# Patient Record
Sex: Female | Born: 1985 | Race: White | Hispanic: No | Marital: Married | Smoking: Never smoker
Health system: Southern US, Community
[De-identification: ages and names within clinical notes are randomized; demographics above are authoritative.]

## PROBLEM LIST (undated history)

## (undated) DIAGNOSIS — F419 Anxiety disorder, unspecified: Secondary | ICD-10-CM

## (undated) DIAGNOSIS — O139 Gestational [pregnancy-induced] hypertension without significant proteinuria, unspecified trimester: Secondary | ICD-10-CM

## (undated) DIAGNOSIS — F32A Depression, unspecified: Secondary | ICD-10-CM

## (undated) DIAGNOSIS — F329 Major depressive disorder, single episode, unspecified: Secondary | ICD-10-CM

## (undated) DIAGNOSIS — E041 Nontoxic single thyroid nodule: Secondary | ICD-10-CM

## (undated) HISTORY — DX: Gestational (pregnancy-induced) hypertension without significant proteinuria, unspecified trimester: O13.9

## (undated) HISTORY — DX: Nontoxic single thyroid nodule: E04.1

## (undated) HISTORY — DX: Major depressive disorder, single episode, unspecified: F32.9

## (undated) HISTORY — DX: Anxiety disorder, unspecified: F41.9

## (undated) HISTORY — PX: HERNIA REPAIR: SHX51

## (undated) HISTORY — DX: Depression, unspecified: F32.A

---

## 2016-12-12 ENCOUNTER — Emergency Department (HOSPITAL_COMMUNITY)
Admission: EM | Admit: 2016-12-12 | Discharge: 2016-12-12 | Disposition: A | Discharge status: Home / Self Care | Payer: Managed Care, Other (non HMO) | Attending: Emergency Medicine | Admitting: Emergency Medicine

## 2016-12-12 ENCOUNTER — Encounter (HOSPITAL_COMMUNITY): Payer: Self-pay | Admitting: Emergency Medicine

## 2016-12-12 ENCOUNTER — Emergency Department (HOSPITAL_COMMUNITY): Payer: Managed Care, Other (non HMO)

## 2016-12-12 DIAGNOSIS — Y998 Other external cause status: Secondary | ICD-10-CM | POA: Insufficient documentation

## 2016-12-12 DIAGNOSIS — Y9351 Activity, roller skating (inline) and skateboarding: Secondary | ICD-10-CM | POA: Diagnosis not present

## 2016-12-12 DIAGNOSIS — Y929 Unspecified place or not applicable: Secondary | ICD-10-CM | POA: Insufficient documentation

## 2016-12-12 DIAGNOSIS — M25531 Pain in right wrist: Secondary | ICD-10-CM | POA: Insufficient documentation

## 2016-12-12 MED ORDER — IBUPROFEN 800 MG PO TABS: 800.0000 mg | ORAL_TABLET | Freq: Three times a day (TID) | ORAL | 0 refills | Status: DC

## 2016-12-12 MED ORDER — IBUPROFEN 800 MG PO TABS
800.0000 mg | ORAL_TABLET | Freq: Once | ORAL | Status: AC
  Administered 2016-12-12: 800 mg via ORAL
  Filled 2016-12-12: qty 1

## 2016-12-12 NOTE — ED Notes (Signed)
Ortho tech in. 

## 2016-12-12 NOTE — ED Provider Notes (Signed)
[facility]-EMERGENCY DEPT Provider Note   CSN: [id number] Arrival date & time: 12/12/16  1159     History   Chief Complaint Chief Complaint  Patient presents with  . Wrist Pain    HPI Adriana Petersen is a 31 y.o. female presenting with right wrist pain after fall.  Patient states she was rollerblading down a hill when she lost control and fell backwards. She states she tried to twist to catch herself, and landed on her palm with her arm outstretched. She reports some minimal bruising on her left buttock, but denies head injury or loss of consciousness. She denies pain elsewhere. She states the pain is on the dorsal aspect of her right wrist at the base of her thumb. She has pain when she moves her wrist or thumb. She denies being on blood thinners. She has not taken anything for pain prior to arrival. She is right-handed. She denies any numbness or tingling in her hand.  HPI  History reviewed. No pertinent past medical history.  There are no active problems to display for this patient.   History reviewed. No pertinent surgical history.  OB History    No data available       Home Medications    Prior to Admission medications   Medication Sig Start Date End Date Taking? Authorizing Provider  ibuprofen (ADVIL,MOTRIN) 800 MG tablet Take 1 tablet (800 mg total) by mouth 3 (three) times daily. 12/12/16   Coco Sharpnack, PA-C    Family History No family history on file.  Social History Social History  Substance Use Topics  . Smoking status: Never Smoker  . Smokeless tobacco: Never Used  . Alcohol use No     Allergies   Patient has no allergy information on record.   Review of Systems Review of Systems  Musculoskeletal: Positive for arthralgias.  Neurological: Negative for numbness.     Physical Exam Updated Vital Signs BP 122/89 (BP Location: Left Arm)   Pulse 94   Temp 98 F (36.7 C) (Oral)   Resp 20   Ht 5\' 2"  (1.575 m)   Wt 66.7 kg (147 lb)   LMP  11/14/2016   SpO2 97%   BMI 26.89 kg/m   Physical Exam  Constitutional: She is oriented to person, place, and time. She appears well-developed and well-nourished. No distress.  HENT:  Head: Normocephalic and atraumatic.  Eyes: Pupils are equal, round, and reactive to light.  Neck: Normal range of motion.  Cardiovascular: Normal rate and regular rhythm.   Pulmonary/Chest: Effort normal and breath sounds normal.  Musculoskeletal:  Patient with obvious swelling at the MCP of the right thumb. No laceration or other injury. Patient with decreased range of motion of the wrist due to pain. Full range of motion of fingers 2 - 5 without pain. Decreased range of motion of the thumb due to pain. Tenderness to palpation at the proximal aspect of the thumb just proximal to the anatomic snuffbox. Pulses intact bilaterally. Sensation intact bilaterally. Color and warmth equal bilaterally. Compartments soft.  Neurological: She is alert and oriented to person, place, and time.  Skin: Skin is warm and dry.  Psychiatric: She has a normal mood and affect.  Nursing note and vitals reviewed.    ED Treatments / Results  Labs (all labs ordered are listed, but only abnormal results are displayed) Labs Reviewed - No data to display  EKG  EKG Interpretation None       Radiology Dg Wrist Complete Right  Result  Date: 12/12/2016 CLINICAL DATA:  Fall EXAM: RIGHT WRIST - COMPLETE 3+ VIEW COMPARISON:  None. FINDINGS: No acute fracture. No dislocation.  Unremarkable soft tissues. IMPRESSION: No acute bony pathology. Electronically Signed   By: Jolaine ClickArthur  Hoss M.D.   On: 12/12/2016 12:59    Procedures Procedures (including critical care time)  Medications Ordered in ED Medications  ibuprofen (ADVIL,MOTRIN) tablet 800 mg (800 mg Oral Given 12/12/16 1348)     Initial Impression / Assessment and Plan / ED Course  I have reviewed the triage vital signs and the nursing notes.  Pertinent labs & imaging  results that were available during my care of the patient were reviewed by me and considered in my medical decision making (see chart for details).     Patient with injury to the right wrist after foosh. Pain on the dorsal aspect at the base of the thumb. Patient is right-handed. X-ray showed no sign of fracture or dislocation. However, since patient has pain near the anatomic snuffbox, will place patient in a thumb spica splint and have her follow-up with hand surgery. Patient neurovascularly intact with soft compartments. Patient to take ibuprofen as needed for pain control. Discussed findings with patient. Patient appears safe for discharge. Return precautions given. Patient states she understands and agrees to plan.  Final Clinical Impressions(s) / ED Diagnoses   Final diagnoses:  Right wrist pain    New Prescriptions Discharge Medication List as of 12/12/2016  1:46 PM    START taking these medications   Details  ibuprofen (ADVIL,MOTRIN) 800 MG tablet Take 1 tablet (800 mg total) by mouth 3 (three) times daily., Starting Sat 12/12/2016, Print         Harrisonaccavale, TremontonSophia, PA-C 12/12/16 2025    Tilden Fossaees, Elizabeth, MD 12/13/16 [phone]

## 2016-12-12 NOTE — ED Notes (Signed)
Patient transported to X-ray 

## 2016-12-12 NOTE — Discharge Instructions (Signed)
You may take ibuprofen as needed for pain. Do not take other anti-inflammatories (including Advil, Motrin, naproxen, or Aleve) at the same time. You may supplement with Tylenol if needed. Follow-up with the hand doctor in the next week for further evaluation of your wrist. Return to the emergency department if you develop numbness, tingling, or any new or worsening symptoms.

## 2016-12-12 NOTE — Progress Notes (Signed)
Orthopedic Tech Progress Note Patient Details:  Adriana Petersen 04/21/1986 [id number]  Ortho Devices Type of Ortho Device: Thumb velcro splint Ortho Device/Splint Interventions: Application   Saul FordyceJennifer C Avila Albritton 12/12/2016, 2:04 PM

## 2016-12-12 NOTE — ED Triage Notes (Signed)
Pt. Stated, I was roller blading and fell on my rt. wrist

## 2016-12-12 NOTE — ED Notes (Signed)
Patient returned from X-ray 

## 2016-12-12 NOTE — ED Notes (Signed)
Returned from xray

## 2016-12-12 NOTE — ED Notes (Signed)
Ortho tech notified of need for thumb spica. 

## 2016-12-28 ENCOUNTER — Other Ambulatory Visit: Payer: Self-pay | Admitting: Orthopedic Surgery

## 2016-12-28 ENCOUNTER — Inpatient Hospital Stay
Admission: RE | Admit: 2016-12-28 | Discharge: 2016-12-28 | Disposition: A | Discharge status: Home / Self Care | Payer: Managed Care, Other (non HMO) | Source: Ambulatory Visit | Attending: Orthopedic Surgery | Admitting: Orthopedic Surgery

## 2016-12-28 DIAGNOSIS — M25531 Pain in right wrist: Secondary | ICD-10-CM

## 2017-01-29 LAB — OB RESULTS CONSOLE RPR: RPR: NONREACTIVE

## 2017-01-29 LAB — OB RESULTS CONSOLE ABO/RH: RH TYPE: POSITIVE

## 2017-01-29 LAB — OB RESULTS CONSOLE RUBELLA ANTIBODY, IGM: RUBELLA: IMMUNE

## 2017-01-29 LAB — OB RESULTS CONSOLE GC/CHLAMYDIA
CHLAMYDIA, DNA PROBE: NEGATIVE
Gonorrhea: NEGATIVE

## 2017-01-29 LAB — OB RESULTS CONSOLE HIV ANTIBODY (ROUTINE TESTING): HIV: NONREACTIVE

## 2017-01-29 LAB — OB RESULTS CONSOLE HEPATITIS B SURFACE ANTIGEN: HEP B S AG: NEGATIVE

## 2017-01-29 LAB — OB RESULTS CONSOLE ANTIBODY SCREEN: Antibody Screen: NEGATIVE

## 2017-06-01 NOTE — L&D Delivery Note (Signed)
Delivery Note At 9:53 PM a viable and healthy female was delivered via Vaginal, Spontaneous (Presentation:OA  ).  APGAR: 8,9 ; weight-pending   .   Placenta status: spont, complete  Cord:  with the following complications: none  .  Cord pH: N/A  Anesthesia:  Pudendal block 20 cc 1% lidocaine Episiotomy: None Lacerations:  none Est. Blood Loss (mL):  300 cc  Mom to postpartum.  Baby to Couplet care / Skin to Skin.  Robley FriesVaishali R Laketta Soderberg 08/13/2017, 10:21 PM

## 2017-07-23 LAB — OB RESULTS CONSOLE GBS: GBS: NEGATIVE

## 2017-08-10 ENCOUNTER — Encounter (HOSPITAL_COMMUNITY): Payer: Self-pay | Admitting: *Deleted

## 2017-08-10 ENCOUNTER — Telehealth (HOSPITAL_COMMUNITY): Payer: Self-pay | Admitting: *Deleted

## 2017-08-10 NOTE — Telephone Encounter (Signed)
Preadmission screen  

## 2017-08-11 ENCOUNTER — Other Ambulatory Visit: Payer: Self-pay | Admitting: Obstetrics & Gynecology

## 2017-08-13 ENCOUNTER — Encounter (HOSPITAL_COMMUNITY): Payer: Self-pay | Admitting: Anesthesiology

## 2017-08-13 ENCOUNTER — Inpatient Hospital Stay (HOSPITAL_COMMUNITY)
Admission: AD | Admit: 2017-08-13 | Discharge: 2017-08-15 | DRG: 807 | Disposition: A | Discharge status: Home / Self Care | Payer: Managed Care, Other (non HMO) | Source: Ambulatory Visit | Attending: Obstetrics & Gynecology | Admitting: Obstetrics & Gynecology

## 2017-08-13 ENCOUNTER — Encounter (HOSPITAL_COMMUNITY): Payer: Self-pay

## 2017-08-13 DIAGNOSIS — O139 Gestational [pregnancy-induced] hypertension without significant proteinuria, unspecified trimester: Secondary | ICD-10-CM | POA: Diagnosis present

## 2017-08-13 DIAGNOSIS — Z3A38 38 weeks gestation of pregnancy: Secondary | ICD-10-CM | POA: Diagnosis not present

## 2017-08-13 DIAGNOSIS — F329 Major depressive disorder, single episode, unspecified: Secondary | ICD-10-CM | POA: Diagnosis present

## 2017-08-13 DIAGNOSIS — F419 Anxiety disorder, unspecified: Secondary | ICD-10-CM | POA: Diagnosis present

## 2017-08-13 DIAGNOSIS — O134 Gestational [pregnancy-induced] hypertension without significant proteinuria, complicating childbirth: Principal | ICD-10-CM | POA: Diagnosis present

## 2017-08-13 DIAGNOSIS — Z3490 Encounter for supervision of normal pregnancy, unspecified, unspecified trimester: Secondary | ICD-10-CM | POA: Diagnosis present

## 2017-08-13 DIAGNOSIS — O99344 Other mental disorders complicating childbirth: Secondary | ICD-10-CM | POA: Diagnosis present

## 2017-08-13 DIAGNOSIS — O133 Gestational [pregnancy-induced] hypertension without significant proteinuria, third trimester: Secondary | ICD-10-CM | POA: Diagnosis present

## 2017-08-13 LAB — ABO/RH: ABO/RH(D): O POS

## 2017-08-13 LAB — CBC
HEMATOCRIT: 37.3 % (ref 36.0–46.0)
Hemoglobin: 12.6 g/dL (ref 12.0–15.0)
MCH: 29.3 pg (ref 26.0–34.0)
MCHC: 33.8 g/dL (ref 30.0–36.0)
MCV: 86.7 fL (ref 78.0–100.0)
Platelets: 229 10*3/uL (ref 150–400)
RBC: 4.3 MIL/uL (ref 3.87–5.11)
RDW: 12.8 % (ref 11.5–15.5)
WBC: 13.1 10*3/uL — AB (ref 4.0–10.5)

## 2017-08-13 LAB — COMPREHENSIVE METABOLIC PANEL
ALT: 6 U/L — AB (ref 14–54)
AST: 17 U/L (ref 15–41)
Albumin: 2.8 g/dL — ABNORMAL LOW (ref 3.5–5.0)
Alkaline Phosphatase: 120 U/L (ref 38–126)
Anion gap: 9 (ref 5–15)
BILIRUBIN TOTAL: 0.5 mg/dL (ref 0.3–1.2)
BUN: 12 mg/dL (ref 6–20)
CALCIUM: 7.9 mg/dL — AB (ref 8.9–10.3)
CO2: 21 mmol/L — ABNORMAL LOW (ref 22–32)
CREATININE: 0.45 mg/dL (ref 0.44–1.00)
Chloride: 104 mmol/L (ref 101–111)
Glucose, Bld: 86 mg/dL (ref 65–99)
Potassium: 4.1 mmol/L (ref 3.5–5.1)
Sodium: 134 mmol/L — ABNORMAL LOW (ref 135–145)
TOTAL PROTEIN: 6.3 g/dL — AB (ref 6.5–8.1)

## 2017-08-13 LAB — TYPE AND SCREEN
ABO/RH(D): O POS
Antibody Screen: NEGATIVE

## 2017-08-13 LAB — PROTEIN / CREATININE RATIO, URINE
Creatinine, Urine: 112 mg/dL
Protein Creatinine Ratio: 0.2 mg/mg{Cre} — ABNORMAL HIGH (ref 0.00–0.15)
Total Protein, Urine: 22 mg/dL

## 2017-08-13 LAB — URIC ACID: Uric Acid, Serum: 4.5 mg/dL (ref 2.3–6.6)

## 2017-08-13 MED ORDER — SOD CITRATE-CITRIC ACID 500-334 MG/5ML PO SOLN: 30.0000 mL | ORAL | Status: DC | PRN

## 2017-08-13 MED ORDER — PHENYLEPHRINE 40 MCG/ML (10ML) SYRINGE FOR IV PUSH (FOR BLOOD PRESSURE SUPPORT)
80.0000 ug | PREFILLED_SYRINGE | INTRAVENOUS | Status: DC | PRN
  Filled 2017-08-13: qty 5

## 2017-08-13 MED ORDER — LACTATED RINGERS IV SOLN: 500.0000 mL | INTRAVENOUS | Status: DC | PRN

## 2017-08-13 MED ORDER — IBUPROFEN 600 MG PO TABS
600.0000 mg | ORAL_TABLET | Freq: Four times a day (QID) | ORAL | Status: DC
  Administered 2017-08-13 – 2017-08-15 (×7): 600 mg via ORAL
  Filled 2017-08-13 (×7): qty 1

## 2017-08-13 MED ORDER — LABETALOL HCL 5 MG/ML IV SOLN: 20.0000 mg | INTRAVENOUS | Status: DC | PRN

## 2017-08-13 MED ORDER — LACTATED RINGERS IV SOLN: 500.0000 mL | Freq: Once | INTRAVENOUS | Status: DC

## 2017-08-13 MED ORDER — MISOPROSTOL 200 MCG PO TABS
800.0000 ug | ORAL_TABLET | Freq: Once | ORAL | Status: AC
  Administered 2017-08-13: 800 ug via RECTAL

## 2017-08-13 MED ORDER — EPHEDRINE 5 MG/ML INJ
10.0000 mg | INTRAVENOUS | Status: DC | PRN
  Filled 2017-08-13: qty 2

## 2017-08-13 MED ORDER — MISOPROSTOL 200 MCG PO TABS
ORAL_TABLET | ORAL | Status: AC
  Filled 2017-08-13: qty 4

## 2017-08-13 MED ORDER — OXYTOCIN 10 UNIT/ML IJ SOLN
INTRAMUSCULAR | Status: AC
  Administered 2017-08-13: 10 [IU]
  Filled 2017-08-13: qty 1

## 2017-08-13 MED ORDER — OXYCODONE-ACETAMINOPHEN 5-325 MG PO TABS: 1.0000 | ORAL_TABLET | ORAL | Status: DC | PRN

## 2017-08-13 MED ORDER — FENTANYL 2.5 MCG/ML BUPIVACAINE 1/10 % EPIDURAL INFUSION (WH - ANES)
INTRAMUSCULAR | Status: AC
  Filled 2017-08-13: qty 100

## 2017-08-13 MED ORDER — PHENYLEPHRINE 40 MCG/ML (10ML) SYRINGE FOR IV PUSH (FOR BLOOD PRESSURE SUPPORT)
PREFILLED_SYRINGE | INTRAVENOUS | Status: AC
  Filled 2017-08-13: qty 20

## 2017-08-13 MED ORDER — OXYTOCIN 40 UNITS IN LACTATED RINGERS INFUSION - SIMPLE MED
1.0000 m[IU]/min | INTRAVENOUS | Status: DC
  Filled 2017-08-13: qty 1000

## 2017-08-13 MED ORDER — FENTANYL 2.5 MCG/ML BUPIVACAINE 1/10 % EPIDURAL INFUSION (WH - ANES): 14.0000 mL/h | INTRAMUSCULAR | Status: DC | PRN

## 2017-08-13 MED ORDER — LACTATED RINGERS IV SOLN: INTRAVENOUS | Status: DC

## 2017-08-13 MED ORDER — OXYTOCIN BOLUS FROM INFUSION: 500.0000 mL | Freq: Once | INTRAVENOUS | Status: DC

## 2017-08-13 MED ORDER — ACETAMINOPHEN 325 MG PO TABS: 650.0000 mg | ORAL_TABLET | ORAL | Status: DC | PRN

## 2017-08-13 MED ORDER — DIPHENHYDRAMINE HCL 50 MG/ML IJ SOLN: 12.5000 mg | INTRAMUSCULAR | Status: DC | PRN

## 2017-08-13 MED ORDER — LIDOCAINE HCL (PF) 1 % IJ SOLN
30.0000 mL | INTRAMUSCULAR | Status: DC | PRN
  Administered 2017-08-13: 30 mL via SUBCUTANEOUS
  Filled 2017-08-13: qty 30

## 2017-08-13 MED ORDER — OXYCODONE-ACETAMINOPHEN 5-325 MG PO TABS: 2.0000 | ORAL_TABLET | ORAL | Status: DC | PRN

## 2017-08-13 MED ORDER — HYDRALAZINE HCL 20 MG/ML IJ SOLN: 10.0000 mg | Freq: Once | INTRAMUSCULAR | Status: DC | PRN

## 2017-08-13 MED ORDER — ONDANSETRON HCL 4 MG/2ML IJ SOLN
4.0000 mg | Freq: Four times a day (QID) | INTRAMUSCULAR | Status: DC | PRN
  Administered 2017-08-13: 4 mg via INTRAVENOUS
  Filled 2017-08-13: qty 2

## 2017-08-13 MED ORDER — TERBUTALINE SULFATE 1 MG/ML IJ SOLN
0.2500 mg | Freq: Once | INTRAMUSCULAR | Status: DC | PRN
Start: 2017-08-13 — End: 2017-08-14
  Filled 2017-08-13: qty 1

## 2017-08-13 NOTE — Progress Notes (Signed)
Patient ID: Adriana Petersen, female   DOB: 08/20/1985, 32 y.o.   MRN: [id number] BP (!) 140/94   Pulse 80   Temp 98.5 F (36.9 C) (Oral)   Resp 17   Ht 5\' 2"  (1.575 m)   Wt 179 lb (81.2 kg)   LMP 11/14/2016   SpO2 100%   BMI 32.74 kg/m  FHT cat I Toco irregular SVE 3-4 cm, 60%, -3, Vx, AROM clear fluid   Pain meds options reviewed

## 2017-08-13 NOTE — MAU Note (Signed)
Call to Dr Juliene PinaMody and pt complaint and current b/p relayed to her. Per dr mody pt to go to l/d and to use induction orders for tomorrow. [facility]

## 2017-08-13 NOTE — Anesthesia Preprocedure Evaluation (Deleted)
Anesthesia Evaluation  Patient identified by MRN, date of birth, ID band Patient awake    Airway        Dental   Pulmonary neg pulmonary ROS,           Cardiovascular hypertension,      Neuro/Psych PSYCHIATRIC DISORDERS Anxiety Depression negative neurological ROS     GI/Hepatic negative GI ROS, Neg liver ROS,   Endo/Other  Obesity  Renal/GU negative Renal ROS  negative genitourinary   Musculoskeletal negative musculoskeletal ROS (+)   Abdominal (+) - obese,   Peds  Hematology negative hematology ROS (+)   Anesthesia Other Findings   Reproductive/Obstetrics (+) Pregnancy Gestational HTN                            Anesthesia Physical Anesthesia Plan  ASA: II  Anesthesia Plan: Epidural   Post-op Pain Management:    Induction:   PONV Risk Score and Plan:   Airway Management Planned: Natural Airway  Additional Equipment:   Intra-op Plan:   Post-operative Plan:   Informed Consent: I have reviewed the patients History and Physical, chart, labs and discussed the procedure including the risks, benefits and alternatives for the proposed anesthesia with the patient or authorized representative who has indicated his/her understanding and acceptance.     Plan Discussed with: Anesthesiologist  Anesthesia Plan Comments: (Patient fully dilated and pushing on arrival to room. No procedure performed. M. Malen GauzeFoster, MD)       Anesthesia Quick Evaluation

## 2017-08-13 NOTE — H&P (Signed)
Adriana Petersen is a 32 y.o. female presenting for IOL for elevated BPs/ Gestational HTN. 38.6 wks, pt was scheduled for IOL tomorrow due to BPs getting worse since last 2 wks but this morning office BP was 146/96. She preferred to go home rest and recheck and agreed to come for IOL if remained in 140/90 range or if developed symptoms, which she denies now.  Good FMs.  Remote Hx of HTN when on OCs but resolved off them, No antiHTN meds since.  G1 SAB  G2- term SVD at 39 wks, quick labor, no epid, 7'7" girl  Anxiety/ depression- Zoloft, plans to continue URI this pregnancy needed Augmentin.   OB History    Gravida Para Term Preterm AB Living   3 1     1 1    SAB TAB Ectopic Multiple Live Births   1       1     Past Medical History:  Diagnosis Date  . Anxiety   . Depression   . Pregnancy induced hypertension   . Thyroid cyst    Past Surgical History:  Procedure Laterality Date  . HERNIA REPAIR     Family History: family history includes COPD in her maternal grandfather; Cancer in her maternal grandfather, maternal grandmother, paternal grandfather, and paternal grandmother; Depression in her sister; Diabetes in her maternal grandfather; Hyperlipidemia in her maternal grandfather; Hypothyroidism in her sister; Leukemia in her paternal grandmother; Stroke in her maternal grandmother. Social History:  reports that  has never smoked. she has never used smokeless tobacco. She reports that she does not drink alcohol or use drugs.     Maternal Diabetes: No Genetic Screening: Declined Maternal Ultrasounds/Referrals: Normal Fetal Ultrasounds or other Referrals:  None Maternal Substance Abuse:  No Significant Maternal Medications:  Zoloft. Augment few months back Significant Maternal Lab Results:  Lab values include: Group B Strep negative Other Comments:  None  ROS History   Blood pressure (!) 146/94, pulse 74, temperature 98.6 F (37 C), temperature source Oral, resp. rate 18,  height 5\' 2"  (1.575 m), weight 179 lb (81.2 kg), last menstrual period 11/14/2016, SpO2 100 %. Exam Physical Exam    A&O x 3, no acute distress. Pleasant HEENT neg, no thyromegaly Lungs CTA bilat CV RRR, S1S2 normal Abdo soft, non tender, non acute Extr no edema/ tenderness Pelvic 2-3 60%/ Vx/ -3 FHT 140s cat I Toco irreg   Prenatal labs: ABO, Rh: O/Positive/-- (08/31 0000) Antibody: Negative (08/31 0000) Rubella: Immune (08/31 0000) RPR: Nonreactive (08/31 0000)  HBsAg: Negative (08/31 0000)  HIV: Non-reactive (08/31 0000)  GBS: Negative (02/22 0000)   Assessment/Plan: 32 yo here for labor IOL for GHTN PIH labs, urine p/c ratio AROM for IOL, desires to defer pitocin.  Anticipate SVD 7.1/2 lbs.    Robley FriesVaishali R Armondo Cech 08/13/2017, 6:49 PM

## 2017-08-13 NOTE — Anesthesia Pain Management Evaluation Note (Signed)
  [facility] Pain Management Visit Note  Patient: Adriana Petersen, 32 y.o., female  "Hello [facility]. We have an anesthesia team available at all times to provide care throughout the hospital, including epidural management and anesthesia for C-section. I don't know your plan for the delivery whether it a natural birth, water birth, IV sedation, nitrous supplementation, doula or epidural, but we want to meet your pain goals."   1.Was your pain managed to your expectations on prior hospitalizations?   Yes   2.What is your expectation for pain management during this hospitalization?     Epidural  3.How can we help you reach that goal? epidural  Record the patient's initial score and the patient's pain goal.   Pain: 9  Pain Goal: 2 [facility] wants you to be able to say your pain was always managed very well.  Hazaiah Edgecombe 08/13/2017

## 2017-08-13 NOTE — MAU Note (Signed)
Pt reports her b/p was elevated in office this am. She is scheduled for induction tomorrow due to b/p and office told her if it stayed up to come to the hospital today. Having some contractions.

## 2017-08-14 ENCOUNTER — Inpatient Hospital Stay (HOSPITAL_COMMUNITY): Admission: RE | Admit: 2017-08-14 | Payer: Managed Care, Other (non HMO) | Source: Ambulatory Visit

## 2017-08-14 LAB — RPR: RPR Ser Ql: NONREACTIVE

## 2017-08-14 LAB — CBC
HEMATOCRIT: 32.8 % — AB (ref 36.0–46.0)
Hemoglobin: 11.2 g/dL — ABNORMAL LOW (ref 12.0–15.0)
MCH: 29.2 pg (ref 26.0–34.0)
MCHC: 34.1 g/dL (ref 30.0–36.0)
MCV: 85.6 fL (ref 78.0–100.0)
Platelets: 176 10*3/uL (ref 150–400)
RBC: 3.83 MIL/uL — AB (ref 3.87–5.11)
RDW: 12.7 % (ref 11.5–15.5)
WBC: 16.2 10*3/uL — ABNORMAL HIGH (ref 4.0–10.5)

## 2017-08-14 MED ORDER — ONDANSETRON HCL 4 MG/2ML IJ SOLN: 4.0000 mg | INTRAMUSCULAR | Status: DC | PRN

## 2017-08-14 MED ORDER — SIMETHICONE 80 MG PO CHEW: 80.0000 mg | CHEWABLE_TABLET | ORAL | Status: DC | PRN

## 2017-08-14 MED ORDER — BENZOCAINE-MENTHOL 20-0.5 % EX AERO: 1.0000 "application " | INHALATION_SPRAY | CUTANEOUS | Status: DC | PRN

## 2017-08-14 MED ORDER — SENNOSIDES-DOCUSATE SODIUM 8.6-50 MG PO TABS
2.0000 | ORAL_TABLET | ORAL | Status: DC
  Administered 2017-08-15: 2 via ORAL
  Filled 2017-08-14: qty 2

## 2017-08-14 MED ORDER — TETANUS-DIPHTH-ACELL PERTUSSIS 5-2.5-18.5 LF-MCG/0.5 IM SUSP: 0.5000 mL | Freq: Once | INTRAMUSCULAR | Status: DC

## 2017-08-14 MED ORDER — ACETAMINOPHEN 325 MG PO TABS
650.0000 mg | ORAL_TABLET | ORAL | Status: DC | PRN
  Administered 2017-08-14: 650 mg via ORAL
  Filled 2017-08-14: qty 2

## 2017-08-14 MED ORDER — DIBUCAINE 1 % RE OINT: 1.0000 "application " | TOPICAL_OINTMENT | RECTAL | Status: DC | PRN

## 2017-08-14 MED ORDER — PRENATAL MULTIVITAMIN CH: 1.0000 | ORAL_TABLET | Freq: Every day | ORAL | Status: DC

## 2017-08-14 MED ORDER — DIPHENHYDRAMINE HCL 25 MG PO CAPS: 25.0000 mg | ORAL_CAPSULE | Freq: Four times a day (QID) | ORAL | Status: DC | PRN

## 2017-08-14 MED ORDER — COCONUT OIL OIL: 1.0000 "application " | TOPICAL_OIL | Status: DC | PRN

## 2017-08-14 MED ORDER — WITCH HAZEL-GLYCERIN EX PADS: 1.0000 "application " | MEDICATED_PAD | CUTANEOUS | Status: DC | PRN

## 2017-08-14 MED ORDER — ONDANSETRON HCL 4 MG PO TABS: 4.0000 mg | ORAL_TABLET | ORAL | Status: DC | PRN

## 2017-08-14 NOTE — Lactation Note (Signed)
This note was copied from a baby's chart. Lactation Consultation Note  Patient Name: Adriana Vista MinkMcKenzie Hillyard WUJWJ'XToday's Date: 08/14/2017 Reason for consult: Initial assessment;Early term 7737-38.6wks  17 hours old early term female who is being exclusively BF by his mother, she's a P2. Mom's concern was that baby is very sleepy and he hasn't eaten since the morning. Explained to mom the importance of waking early term babies up every 3 hours to feed if they're not showing cues.   Baby was already nursing when entering the room, [facility], burping and STS when mom switched breasts. Baby was able to latch but after a few minutes he fell asleep at the breast, swallows were heard though and mom also stated that feedings at the breast were comfortable and she didn't have the problem she had with her oldest child, this baby will nurse out of both breasts.  Encouraged mom to feed baby on cues and STS 8-12 times/24 hours and to wake baby up for feedings every 3 if he's not showing cues. Reviewed BF brochure, BF resources and feeding diary, mom is aware of LC services and will call PRN.  Maternal Data Formula Feeding for Exclusion: No Has patient been taught Hand Expression?: Yes Does the patient have breastfeeding experience prior to this delivery?: Yes  Feeding Feeding Type: Breast Fed Length of feed: 22 min  LATCH Score Latch: Repeated attempts needed to sustain latch, nipple held in mouth throughout feeding, stimulation needed to elicit sucking reflex.  Audible Swallowing: A few with stimulation  Type of Nipple: Everted at rest and after stimulation  Comfort (Breast/Nipple): Soft / non-tender  Hold (Positioning): Assistance needed to correctly position infant at breast and maintain latch.  LATCH Score: 7  Interventions Interventions: Breast feeding basics reviewed;Assisted with latch;Skin to skin;Breast massage;Breast compression;Adjust position;Support pillows;Position  options  Lactation Tools Discussed/Used WIC Program: No   Consult Status Consult Status: Follow-up Date: 08/15/17 Follow-up type: In-patient    Mayara Paulson Venetia ConstableS Lan Mcneill 08/14/2017, 3:48 PM

## 2017-08-14 NOTE — Progress Notes (Signed)
MOB was referred for history of depression/anxiety. * Referral screened out by Clinical Social Worker because none of the following criteria appear to apply: ~ History of anxiety/depression during this pregnancy, or of post-partum depression. ~ Diagnosis of anxiety and/or depression within last 3 years OR * MOB's symptoms currently being treated with medication and/or therapy. Please contact the Clinical Social Worker if needs arise, by MOB request, or if MOB scores greater than 9/yes to question 10 on Edinburgh Postpartum Depression Screen.  MOB has Rx for Zoloft. 

## 2017-08-14 NOTE — Progress Notes (Signed)
PPD # 1 SVD Information for the patient's newborn:  Zelphia CairoStenberg, Boy Makensie [[id number]][[id number]]  female      brest feeding - baby very sleepy, not latched for past 6 hours  / Circumcision planned tomorrow Baby name: Larae GroomsJacob  S:  Reports feeling tired but well             Tolerating po/ No nausea or vomiting             Bleeding is moderate             Pain controlled with ibuprofen (OTC)             Up ad lib / ambulatory / voiding without difficulties        O:  A & O x 3, in no apparent distress              VS:  Vitals:   08/13/17 2307 08/13/17 2327 08/14/17 0030 08/14/17 0500  BP: 132/87 (!) 137/91 128/83 (!) 126/93  Pulse: 79 74 68 79  Resp: 18 16 18 16   Temp:  98.5 F (36.9 C) 98.5 F (36.9 C) 98 F (36.7 C)  TempSrc:  Oral Oral Oral  SpO2:      Weight:      Height:        LABS:  Recent Labs    08/13/17 1943 08/14/17 0557  WBC 13.1* 16.2*  HGB 12.6 11.2*  HCT 37.3 32.8*  PLT 229 176    Blood type: --/--/O POS (03/15 1943)  Rubella: Immune (08/31 0000)   I&O: I/O last 3 completed shifts: In: -  Out: 300 [Blood:300]          No intake/output data recorded.  Lungs: Clear and unlabored  Heart: regular rate and rhythm / no murmurs  Abdomen: soft, non-tender, non-distended             Fundus: firm, non-tender, U-1  Perineum: intact  Lochia: moderate  Extremities: trace pedal edema, no calf pain or tenderness    A/P: PPD # 1 31 y.o., [id number]   Principal Problem:   SVD (spontaneous vaginal delivery) 3/15 Active Problems:   Encounter for planned induction of labor   Gestational HTN - labile BP in mild range, no neural s/s - continue to monitor closely    Postpartum care following vaginal delivery   Doing well - stable status  Routine post partum orders  Anticipate discharge tomorrow    Neta Mendsaniela C Collier Monica, MSN, CNM 08/14/2017, 10:53 AM

## 2017-08-15 MED ORDER — BENZOCAINE-MENTHOL 20-0.5 % EX AERO: 1.0000 "application " | INHALATION_SPRAY | CUTANEOUS | Status: DC | PRN

## 2017-08-15 MED ORDER — COCONUT OIL OIL: 1.0000 "application " | TOPICAL_OIL | 0 refills | Status: DC | PRN

## 2017-08-15 MED ORDER — IBUPROFEN 600 MG PO TABS: 600.0000 mg | ORAL_TABLET | Freq: Four times a day (QID) | ORAL | 0 refills | Status: AC

## 2017-08-15 MED ORDER — LORATADINE 10 MG PO TABS: 10.0000 mg | ORAL_TABLET | Freq: Every day | ORAL | Status: DC

## 2017-08-15 MED ORDER — SERTRALINE HCL 50 MG PO TABS
50.0000 mg | ORAL_TABLET | Freq: Every day | ORAL | Status: DC
  Filled 2017-08-15 (×2): qty 1

## 2017-08-15 NOTE — Discharge Summary (Addendum)
Obstetric Discharge Summary Reason for Admission: induction of labor and Gestational hypertension Prenatal Procedures: ultrasound Intrapartum Procedures: spontaneous vaginal delivery Postpartum Procedures: none Complications-Operative and Postpartum: none Hemoglobin  Date Value Ref Range Status  08/14/2017 11.2 (L) 12.0 - 15.0 g/dL Final   HCT  Date Value Ref Range Status  08/14/2017 32.8 (L) 36.0 - 46.0 % Final    Physical Exam:  General: alert, cooperative and no distress Lochia: appropriate Uterine Fundus: firm DVT Evaluation: No evidence of DVT seen on physical exam. No cords or calf tenderness.  Discharge Diagnoses: Term Pregnancy-delivered and Gestational hypertension  Discharge Information: Date: 08/15/2017 Activity: pelvic rest Diet: routine Medications: See med list  Allergies as of 08/15/2017   No Known Allergies     Medication List    TAKE these medications   acetaminophen 500 MG tablet Commonly known as:  TYLENOL Take 500 mg by mouth every 6 (six) hours as needed for moderate pain.   benzocaine-Menthol 20-0.5 % Aero Commonly known as:  DERMOPLAST Apply 1 application topically as needed for irritation (perineal discomfort).   coconut oil Oil Apply 1 application topically as needed.   ferrous sulfate 325 (65 FE) MG tablet Take 325 mg by mouth daily with breakfast.   ibuprofen 600 MG tablet Commonly known as:  ADVIL,MOTRIN Take 1 tablet (600 mg total) by mouth every 6 (six) hours.   loratadine 10 MG tablet Commonly known as:  CLARITIN Take 10 mg by mouth daily.   sertraline 50 MG tablet Commonly known as:  ZOLOFT Take 50 mg by mouth daily.   Vitamin C 500 MG Chew Chew 500 mg by mouth daily.            Discharge Care Instructions  (From admission, onward)        Start     Ordered   08/15/17 0000  Discharge wound care:    Comments:  Sitz baths 2 times /day with warm water x 1 week   08/15/17 1103      Condition:  stable Instructions: refer to practice specific booklet Discharge to: home   Newborn Data: Live born female  Birth Weight: 6 lb 10.5 oz (3020 g) APGAR: 9, 9  Newborn Delivery   Birth date/time:  08/13/2017 21:53:00 Delivery type:  Vaginal, Spontaneous     Home with mother.  Rhea PinkVivian Grice, SNM 08/15/2017, 11:27 AM   Medical screening examination/treatment/procedure(s) were conducted as a shared visit with non-physician practitioner(s) and myself.  I personally evaluated the patient during the encounter.   Neta Mendsaniela C Jeananne Bedwell, CNM, MSN 08/15/2017, 11:50 AM

## 2017-08-15 NOTE — Progress Notes (Addendum)
Post Partum Day #2           Information for the patient's newborn:  Zelphia CairoStenberg, Boy Monica [[id number]][[id number]]  female     Circumcision: Completed 08/15/17 Baby name: "Gerilyn PilgrimJacob" Feeding: breast  Subjective: No HA, SOB, CP, F/C, breast symptoms. Pain well-controlled with Ibuprofen. Normal vaginal bleeding, no clots.      Objective:  VS:  Vitals:   08/14/17 1445 08/14/17 1711 08/15/17 0530 08/15/17 1040  BP: 136/89 138/83 136/86 (!) 122/97  Pulse: 80 77 63 89  Resp:  18 18 18   Temp:  98.3 F (36.8 C) (!) 97.5 F (36.4 C) 97.6 F (36.4 C)  TempSrc:  Oral Oral Oral  SpO2:      Weight:      Height:        No intake or output data in the 24 hours ending 08/15/17 1117    Recent Labs    08/13/17 1943 08/14/17 0557  WBC 13.1* 16.2*  HGB 12.6 11.2*  HCT 37.3 32.8*  PLT 229 176    Blood type: --/--/O POS (03/15 1943) Rubella: Immune (08/31 0000)    Physical Exam:  General: alert, cooperative and no distress Uterine Fundus: firm Lochia: appropriate Perineum: Intact, edema none DVT Evaluation: No evidence of DVT seen on physical exam. No cords, calf tenderness, trace edema to BLE    Assessment/Plan: PPD # 2 / 32 y.o., [id number] S/P:induced vaginal   Principal Problem:   SVD (spontaneous vaginal delivery) 3/15 Active Problems:   Encounter for planned induction of labor   Gestational HTN  [facility]  -Call office to report HA, visual changes, epigastric pain   Postpartum care following vaginal delivery  Normal postpartum exam  Continue current postpartum care             DC home today w/ instructions  [facility] and PRN   LOS: 2 days   Rhea PinkVivian Grice, SNM 08/15/2017, 11:17 AM

## 2018-07-08 ENCOUNTER — Ambulatory Visit (HOSPITAL_COMMUNITY)
Admission: EM | Admit: 2018-07-08 | Discharge: 2018-07-08 | Disposition: A | Discharge status: Home / Self Care | Payer: Self-pay | Attending: Family Medicine | Admitting: Family Medicine

## 2018-07-08 ENCOUNTER — Other Ambulatory Visit: Payer: Self-pay

## 2018-07-08 ENCOUNTER — Encounter (HOSPITAL_COMMUNITY): Payer: Self-pay | Admitting: Emergency Medicine

## 2018-07-08 DIAGNOSIS — H66001 Acute suppurative otitis media without spontaneous rupture of ear drum, right ear: Secondary | ICD-10-CM

## 2018-07-08 MED ORDER — AMOXICILLIN 875 MG PO TABS: 875.0000 mg | ORAL_TABLET | Freq: Two times a day (BID) | ORAL | 0 refills | Status: AC

## 2018-07-08 NOTE — ED Provider Notes (Signed)
[facility]   [id number] 07/08/18 Arrival Time: 1735  ASSESSMENT & PLAN:  1. Non-recurrent acute suppurative otitis media of right ear without spontaneous rupture of tympanic membrane    Meds ordered this encounter  Medications  . amoxicillin (AMOXIL) 875 MG tablet    Sig: Take 1 tablet (875 mg total) by mouth 2 (two) times daily for 10 days.    Dispense:  20 tablet    Refill:  0   OTC symptom care as needed. Ensure adequate fluid intake and rest. May f/u with PCP or here as needed.  Reviewed expectations re: course of current medical issues. Questions answered. Outlined signs and symptoms indicating need for more acute intervention. Patient verbalized understanding. After Visit Summary given.   SUBJECTIVE: History from: patient.  Adriana Petersen is a 33 y.o. female who presents with complaint of right otalgia; without drainage; without bleeding. Onset gradual, 3 days ago. Recent cold symptoms: minimal. Mild ST but this improving. Fever: no. Overall normal PO intake without n/v. Sick contacts: no. OTC treatment: Tylenol with mild help.  Social History   Tobacco Use  Smoking Status Never Smoker  Smokeless Tobacco Never Used    ROS: As per HPI.   OBJECTIVE:  Vitals:   07/08/18 1818  BP: 121/88  Pulse: 82  Resp: 18  Temp: 98 F (36.7 C)  TempSrc: Oral  SpO2: 100%     General appearance: alert; appears fatigued Ear Canal: normal TM: right: erythematous, bulging Neck: supple without LAD Lungs: unlabored respirations, symmetrical air entry; cough: absent; no respiratory distress Skin: warm and dry Psychological: alert and cooperative; normal mood and affect  No Known Allergies  Past Medical History:  Diagnosis Date  . Anxiety   . Depression   . Pregnancy induced hypertension   . Thyroid cyst    Family History  Problem Relation Age of Onset  . Hypothyroidism Sister   . Depression Sister   . Cancer Maternal Grandmother   . Stroke  Maternal Grandmother   . Diabetes Maternal Grandfather   . Cancer Maternal Grandfather   . COPD Maternal Grandfather   . Hyperlipidemia Maternal Grandfather   . Cancer Paternal Grandmother   . Leukemia Paternal Grandmother   . Cancer Paternal Grandfather    Social History   Socioeconomic History  . Marital status: Married    Spouse name: Not on file  . Number of children: Not on file  . Years of education: Not on file  . Highest education level: Not on file  Occupational History  . Not on file  Social Needs  . Financial resource strain: Not on file  . Food insecurity:    Worry: Not on file    Inability: Not on file  . Transportation needs:    Medical: Not on file    Non-medical: Not on file  Tobacco Use  . Smoking status: Never Smoker  . Smokeless tobacco: Never Used  Substance and Sexual Activity  . Alcohol use: No  . Drug use: No  . Sexual activity: Yes  Lifestyle  . Physical activity:    Days per week: Not on file    Minutes per session: Not on file  . Stress: Not on file  Relationships  . Social connections:    Talks on phone: Not on file    Gets together: Not on file    Attends religious service: Not on file    Active member of club or organization: Not on file    Attends meetings of  clubs or organizations: Not on file    Relationship status: Not on file  . Intimate partner violence:    Fear of current or ex partner: Not on file    Emotionally abused: Not on file    Physically abused: Not on file    Forced sexual activity: Not on file  Other Topics Concern  . Not on file  Social History Narrative  . Not on file            Mardella Layman, MD 07/13/18 1044

## 2018-07-08 NOTE — ED Triage Notes (Signed)
The patient presented to the [facility] with a complaint of a sore throat and right ear pain x 3 days.

## 2018-07-22 IMAGING — DX DG WRIST COMPLETE 3+V*R*
4 series · 4 of 4 positions shown · non-contrast
Comparison: None.

CLINICAL DATA: Fall

EXAM:
RIGHT WRIST - COMPLETE 3+ VIEW

[wrist pa]
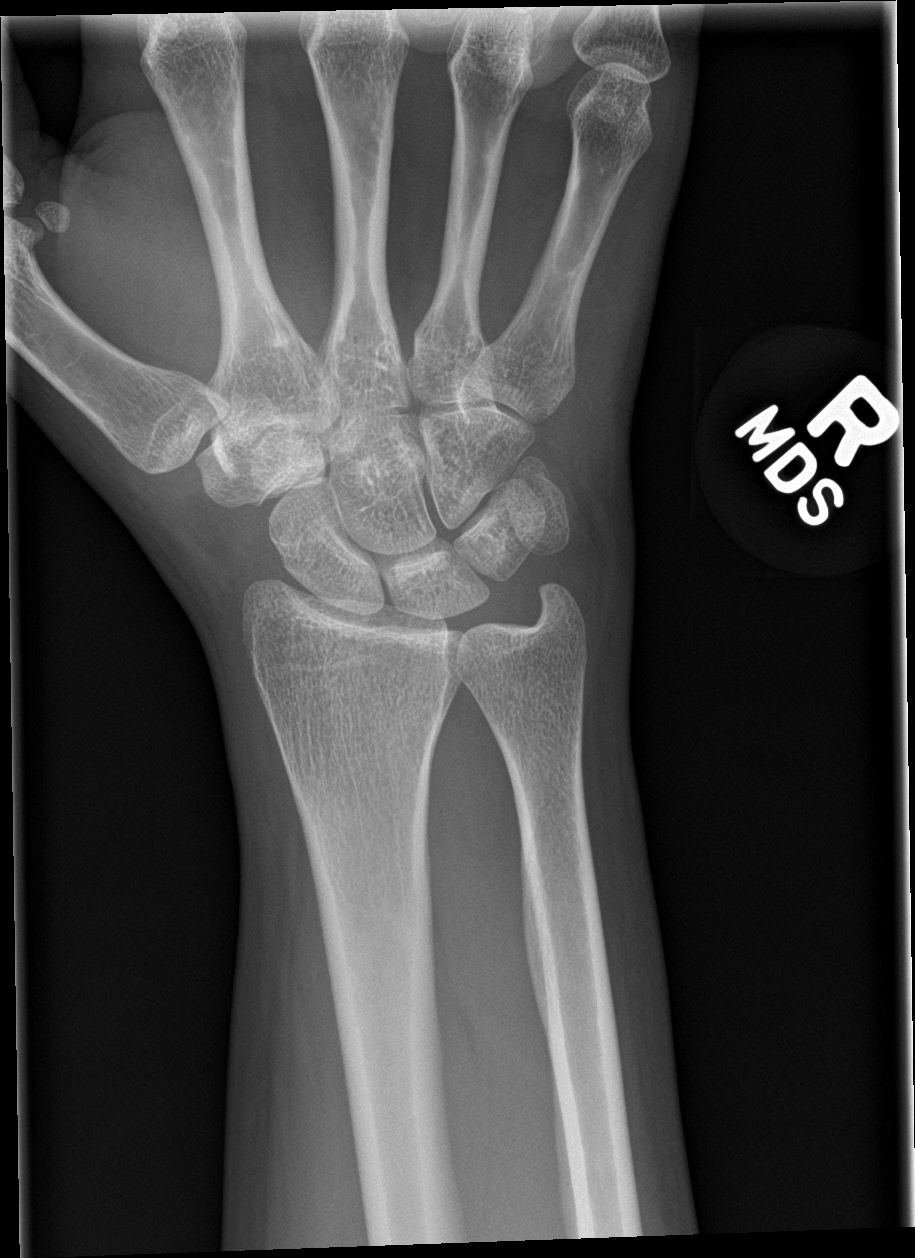

[wrist obl]
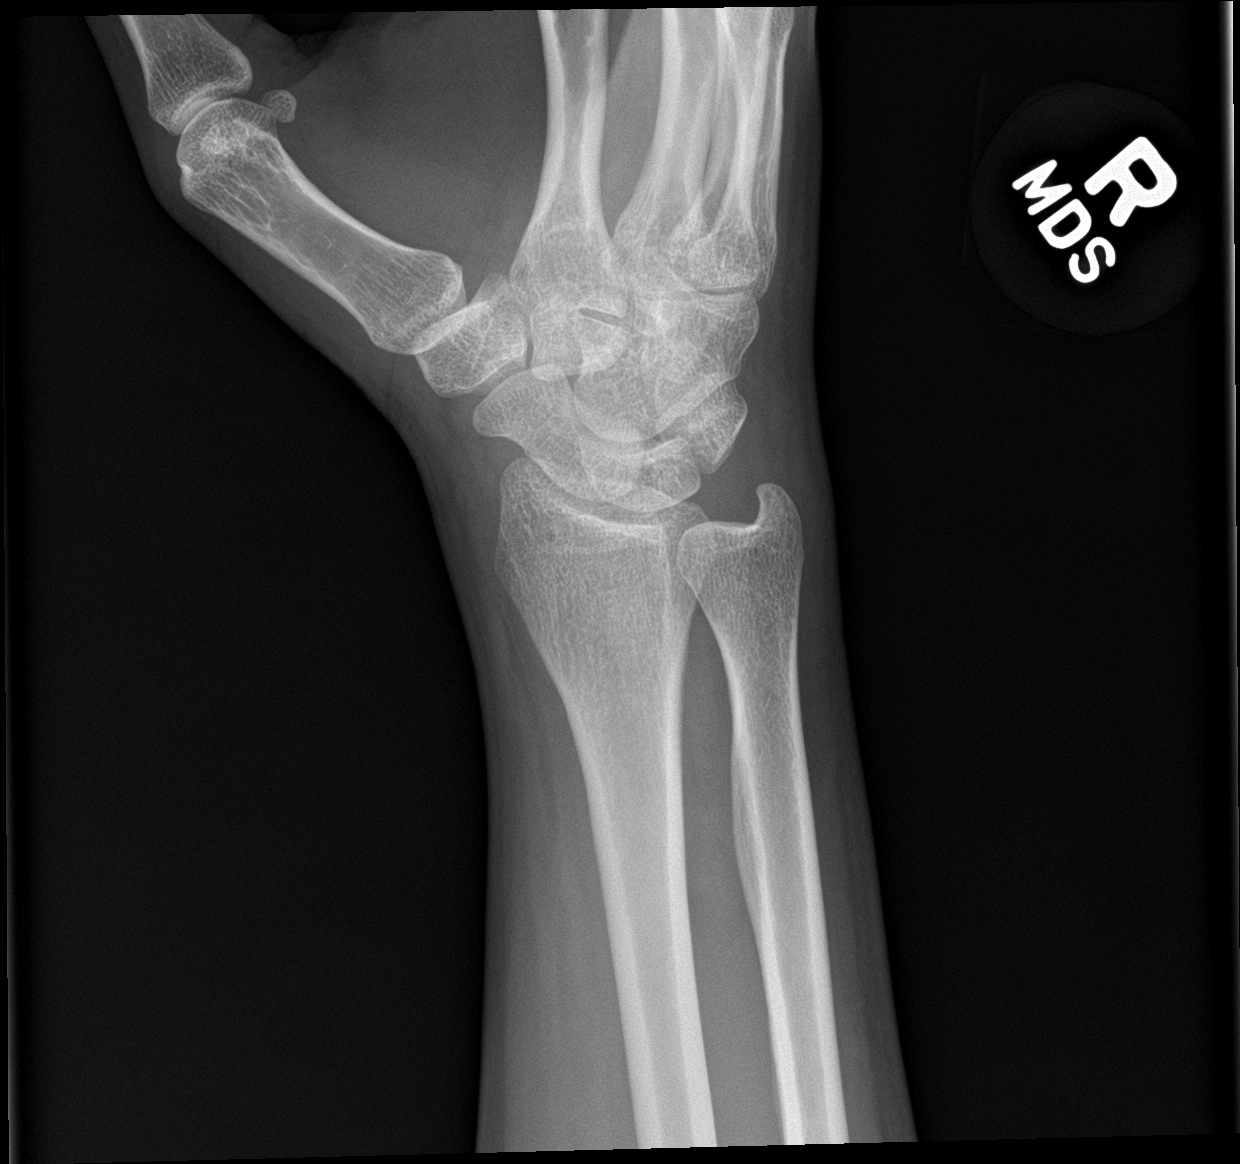

[wrist lat]
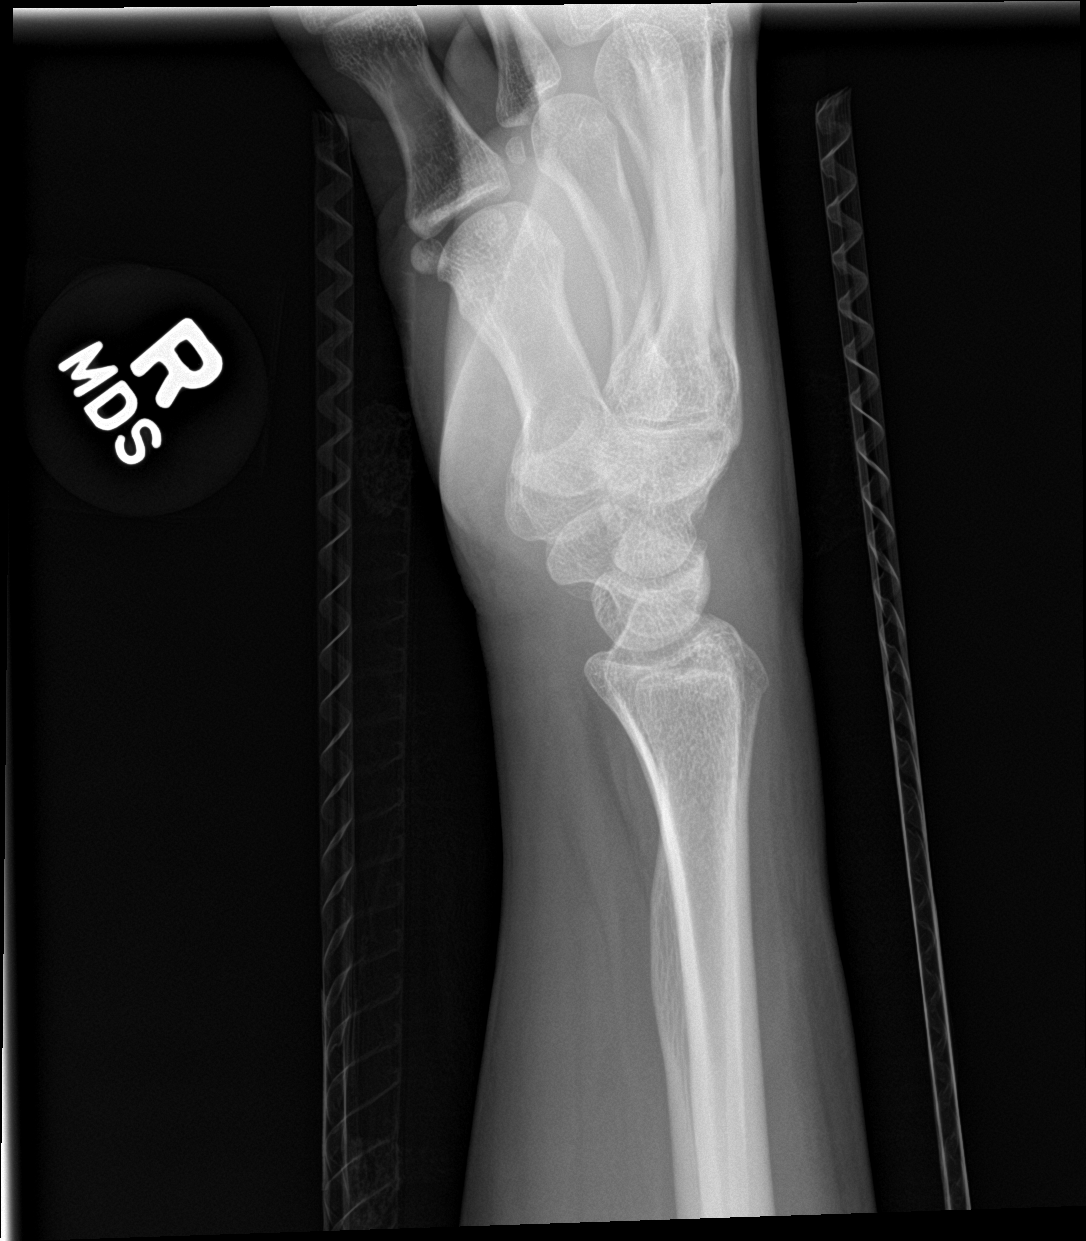

[wrist navicular]
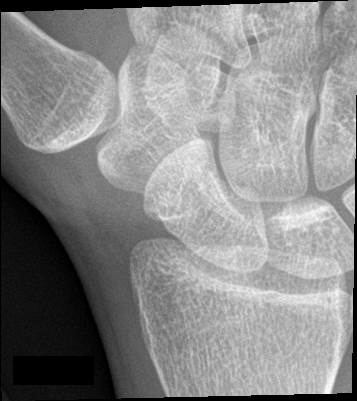

[4 of 4 positions shown; findings below may reference images not displayed]

FINDINGS: No acute fracture. No dislocation.  Unremarkable soft tissues.
IMPRESSION: No acute bony pathology.

## 2024-01-11 ENCOUNTER — Other Ambulatory Visit: Payer: Self-pay

## 2024-01-11 ENCOUNTER — Ambulatory Visit: Attending: Obstetrics & Gynecology

## 2024-01-11 DIAGNOSIS — R1030 Lower abdominal pain, unspecified: Secondary | ICD-10-CM | POA: Insufficient documentation

## 2024-01-11 DIAGNOSIS — M5459 Other low back pain: Secondary | ICD-10-CM | POA: Diagnosis present

## 2024-01-11 DIAGNOSIS — R279 Unspecified lack of coordination: Secondary | ICD-10-CM | POA: Insufficient documentation

## 2024-01-11 DIAGNOSIS — M6281 Muscle weakness (generalized): Secondary | ICD-10-CM | POA: Diagnosis present

## 2024-01-11 DIAGNOSIS — R293 Abnormal posture: Secondary | ICD-10-CM | POA: Diagnosis present

## 2024-01-11 DIAGNOSIS — R102 Pelvic and perineal pain: Secondary | ICD-10-CM | POA: Diagnosis present

## 2024-01-11 DIAGNOSIS — M62838 Other muscle spasm: Secondary | ICD-10-CM | POA: Insufficient documentation

## 2024-01-11 NOTE — Therapy (Signed)
 OUTPATIENT PHYSICAL THERAPY FEMALE PELVIC EVALUATION   Patient Name: Adriana Petersen MRN: [id number] DOB:07/27/1985, 38 y.o., female Today's Date: 01/11/2024  END OF SESSION:  PT End of Session - 01/11/24 1528     Visit Number 1    Date for PT Re-Evaluation 06/27/24    Authorization Type [employer]    PT Start Time 1529    PT Stop Time 1611    PT Time Calculation (min) 42 min    Activity Tolerance Patient tolerated treatment well    Behavior During Therapy WFL for tasks assessed/performed          Past Medical History:  Diagnosis Date   Anxiety    Depression    Pregnancy induced hypertension    Thyroid cyst    Past Surgical History:  Procedure Laterality Date   HERNIA REPAIR     Patient Active Problem List   Diagnosis Date Noted   SVD (spontaneous vaginal delivery) 3/15 08/14/2017   Postpartum care following vaginal delivery 08/14/2017   Encounter for planned induction of labor 08/13/2017   Gestational HTN 08/13/2017    PCP: NA  REFERRING PROVIDER: Barbette Knock, MD  REFERRING DIAG: M62.89 (ICD-10-CM) - Other specified disorders of muscle   THERAPY DIAG:  Pelvic pain  Other low back pain  Lower abdominal pain  Other muscle spasm  Muscle weakness (generalized)  Unspecified lack of coordination  Abnormal posture  Rationale for Evaluation and Treatment: Rehabilitation  ONSET DATE: 09/30/23  SUBJECTIVE:                                                                                                                                                                                           SUBJECTIVE STATEMENT: Pt states that she is having pelvic muscle pain that also causes low back pain. She is also having very heavy bleeding with menstrual cycles that are painful; she had an UID placed on 12/13/23. Has tried pelvic floor physical therapy before due to leaking and just looking for more control.   PAIN:  Are you having pain? Yes NPRS scale: 4/10 currently,  9/10 at worst Pain location: pelvic pain  Pain type: aching Pain description: pelvis, abdomen, low back   Aggravating factors: lifting, menstrual cycles, sitting in one position  Relieving factors: adjusting position, heating pad  PRECAUTIONS: None  RED FLAGS: None   WEIGHT BEARING RESTRICTIONS: No  FALLS:  Has patient fallen in last 6 months? 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   ACTIVITY LEVEL : very active at work; run/walks with group 1x/work  PLOF: Independent  PATIENT GOALS: to decrease pain  PERTINENT HISTORY:  Hernia repair  2002; 2 vaginal deliveries  Sexual abuse: No  BOWEL MOVEMENT: Pain with bowel movement: No Type of bowel movement:Type (Bristol Stool Scale) 3-4, Frequency 2x/week, and Strain not usually  Fully empty rectum: Yes: sometimes  Leakage: No Pads: No Fiber supplement/laxative No  URINATION: Pain with urination: No Fully empty bladder: Yes:   Stream: hard to start Urgency: Yes  Frequency: 3-4x/day; not usually waking at night  Fluid Intake: not enough - only 16 oz at 4 today Leakage: Urge to void, Walking to the bathroom, Coughing, Sneezing, Laughing, Exercise, Lifting, and running Pads: No  INTERCOURSE:  Ability to have vaginal penetration Yes  Pain with intercourse: During Penetration, Deep Penetration, and refers up into Rt abdomen; aches after intercourse  DrynessNo Climax: pain with orgasms  Marinoff Scale: 0/3 Lubricant: not usually  PREGNANCY: Vaginal deliveries 2 Tearing No Episiotomy No C-section deliveries 0 Currently pregnant No  PROLAPSE: None   OBJECTIVE:  Note: Objective measures were completed at Evaluation unless otherwise noted.  01/11/24: PATIENT SURVEYS:   PFIQ-7: 44  COGNITION: Overall cognitive status: Within functional limits for tasks assessed     SENSATION: Light touch: Appears intact   FUNCTIONAL TESTS:  Squat: Rt weight shift and breath holding  Single leg stance:  Rt:  stable  Lt: pelvic drop Curl-up test: abdominal distortion   GAIT: Assistive device utilized: None Comments: WNL  POSTURE: rounded shoulders, forward head, anterior pelvic tilt, and Lt posterior rotation, Rt thoracic curvature, Lt lumbar curvature   LUMBARAROM/PROM:  A/PROM A/PROM  Eval (% available)  Flexion 100  Extension 100, pain in low back, stretching in abdomen  Right lateral flexion 100  Left lateral flexion 100  Right rotation 100  Left rotation 100   (Blank rows = not tested)  PALPATION:   General: tenderness and restriction in low back  Pelvic Alignment: Lt pelvic rotation   Abdominal: tenderness throughout lower abdomen; apical breathing pattern                 External Perineal Exam: some dryness and labial fusion                             Internal Pelvic Floor: tenderness throughout bil superficial and deep pelvic floor muscles   Patient confirms identification and approves PT to assess internal pelvic floor and treatment Yes  PELVIC MMT:   MMT eval  Vaginal 2/5, 5 second hold, 5 repeat contractions with poor coordination  Diastasis Recti 2 finger widths  (Blank rows = not tested)        TONE: High bil superficial nd deep pelvic floor muscles   PROLAPSE: Grade 1 anterior vaginal wall laxity; grade 1 uterine ligament laxity  TODAY'S TREATMENT:                                                                                                                              DATE:  01/11/24 EVAL  Neuromuscular re-education: Diaphragmatic breathing for pelvic floor muscle relaxation and improved mobility Down training HEP Cat cow Child's pose Happy baby Butterfly    PATIENT EDUCATION:  Education details: See above Person educated: Patient Education method: Explanation, Demonstration, Tactile cues, Verbal cues, and Handouts Education comprehension: verbalized understanding  HOME EXERCISE PROGRAM: [id number]  ASSESSMENT:  CLINICAL  IMPRESSION: Patient is a 38 y.o. female who was seen today for physical therapy evaluation and treatment for pelvic/low back/abdominal pain, constipation, and urinary incontinence. Exam findings notable for abnormal posture, pelvic rotation, apical breathing pattern, core weakness with abdominal distortion, pelvic drop in Lt single leg stance, abdominal and low back tenderness, pain throughout pelvic floor muscles superficial and deep, pelvic floor muscle weakness and decreased endurance, significant difficulty with appropriate pelvic floor muscle coordination, and anterior wall/uterine ligament laxity. Signs and symptoms are most consistent with pelvic floor muscle spasm, poor pressure management and core weakness, and uterine ligament/anterior vaginal wall laxity. Initial treatment included diaphragmatic breathing for improved pelvic floor muscle relaxation and down training exercises to work on. She will continue to benefit from skilled PT intervention in order to decrease pain, improve constipation, decrease urinary incontinence, and address impairments.    OBJECTIVE IMPAIRMENTS: decreased activity tolerance, decreased coordination, decreased endurance, decreased mobility, decreased ROM, decreased strength, increased fascial restrictions, increased muscle spasms, impaired flexibility, impaired tone, improper body mechanics, postural dysfunction, and pain.   ACTIVITY LIMITATIONS: lifting, squatting, and continence  PARTICIPATION LIMITATIONS: cleaning, interpersonal relationship, driving, community activity, occupation, and exercise  PERSONAL FACTORS: 1-2 comorbidities: medical history are also affecting patient's functional outcome.   REHAB POTENTIAL: Good  CLINICAL DECISION MAKING: Evolving/moderate complexity  EVALUATION COMPLEXITY: Moderate   GOALS: Goals reviewed with patient? Yes  SHORT TERM GOALS: Target date: 02/08/2024   Pt will be independent with HEP in order to improve activity  tolerance.   Baseline: Goal status: INITIAL  2.  Patient will report 25% improve in pelvic pain in order to increase activity tolerance.    Baseline: 9/10 at worst Goal status: INITIAL  3.  Pt will be independent with use of squatty potty, relaxed toileting mechanics, and improved bowel movement techniques in order to increase ease of bowel movements and complete evacuation.    Baseline: not using Goal status: INITIAL  4.  Pt will be independent with the knack, urge suppression technique, and double voiding in order to improve bladder habits and decrease urinary incontinence.   Baseline: not using Goal status: INITIAL  5.  Pt will be able to fully relax and contract pelvic floor in order to improve pelvic pain and urinary incontinence to work with less difficulty Baseline: high tone and poor coordination with contract/relax Goal status: INITIAL  6.  Pt will be able to correctly perform diaphragmatic breathing and appropriate pressure management in order to prevent worsening vaginal wall/uterine ligament laxity and improve pelvic floor A/ROM.   Baseline: uterine ligament and anterior vaginal wall laxity Goal status: INITIAL  LONG TERM GOALS: Target date: 06/27/2024  Pt will be independent with advanced HEP in order to improve activity tolerance.   Baseline:  Goal status: INITIAL  2.  Patient will report 75% improve in pelvic pain in order to increase activity tolerance.   Baseline: 9/10 at worst Goal status: INITIAL  3.  Pt will be able to go 2-3 hours in between voids without urgency or incontinence in order to improve QOL and perform all functional activities with less difficulty.   Baseline: strong urgency and will  leak on the way to the bathroom Goal status: INITIAL  4.  Pt will be able to laugh, cough, sneeze, jump, lift, and run without urinary incontinence.  Baseline: leaking with all Goal status: INITIAL  5.  Patient will be able to perform single leg stance  with good pelvic stability in order to demonstrate appropriate pelvic floor muscle and transversus abdominus strength and coordination in order to decrease urinary incontinence and improve pain.   Baseline:  Goal status: INITIAL  6.  Pt will report 0/10 pain with vaginal penetration in order to improve intimate relationship with partner.    Baseline: pain that stops intercourse Goal status: INITIAL  PLAN:  PT FREQUENCY: 1-2x/week  PT DURATION: 16 visits    PLANNED INTERVENTIONS: 97164- PT Re-evaluation, 97110-Therapeutic exercises, 97530- Therapeutic activity, 97112- Neuromuscular re-education, [phone]- Self Care, [id number]- Manual therapy, [phone]- Gait training, [phone]- Aquatic Therapy, [phone]- Electrical stimulation (unattended), [phone]- Traction (mechanical), [id number]- Ionotophoresis 4mg /ml Dexamethasone, 79439 (1-2 muscles), 20561 (3+ muscles)- Dry Needling, Patient/Family education, Balance training, Taping, Joint mobilization, Joint manipulation, Spinal manipulation, Spinal mobilization, Scar mobilization, Vestibular training, Cryotherapy, Moist heat, and Biofeedback  PLAN FOR NEXT SESSION: begin pelvic floor muscle release to superficial/deep musculature vaginally; manual techniques to abdomen and low back; progress mobility activities and down training; begin pelvic stability exercises; bladder habits; bowel habits   Josette Mares, PT, DPT08/12/255:26 PM

## 2024-02-22 ENCOUNTER — Ambulatory Visit: Attending: Obstetrics & Gynecology

## 2024-02-22 DIAGNOSIS — M62838 Other muscle spasm: Secondary | ICD-10-CM | POA: Diagnosis present

## 2024-02-22 DIAGNOSIS — R1030 Lower abdominal pain, unspecified: Secondary | ICD-10-CM | POA: Diagnosis present

## 2024-02-22 DIAGNOSIS — R279 Unspecified lack of coordination: Secondary | ICD-10-CM | POA: Insufficient documentation

## 2024-02-22 DIAGNOSIS — M6281 Muscle weakness (generalized): Secondary | ICD-10-CM | POA: Diagnosis present

## 2024-02-22 DIAGNOSIS — R102 Pelvic and perineal pain: Secondary | ICD-10-CM | POA: Diagnosis present

## 2024-02-22 DIAGNOSIS — M5459 Other low back pain: Secondary | ICD-10-CM | POA: Insufficient documentation

## 2024-02-22 DIAGNOSIS — R293 Abnormal posture: Secondary | ICD-10-CM | POA: Diagnosis present

## 2024-02-22 NOTE — Therapy (Signed)
 OUTPATIENT PHYSICAL THERAPY FEMALE PELVIC TREATMENT   Patient Name: Adriana Petersen MRN: [id number] DOB:10-Mar-1986, 38 y.o., female Today's Date: 02/22/2024  END OF SESSION:  PT End of Session - 02/22/24 1449     Visit Number 2    Date for Recertification  06/27/24    Authorization Type BCBS    PT Start Time 1449    PT Stop Time 1527    PT Time Calculation (min) 38 min    Activity Tolerance Patient tolerated treatment well    Behavior During Therapy [facility] for tasks assessed/performed           Past Medical History:  Diagnosis Date   Anxiety    Depression    Pregnancy induced hypertension    Thyroid cyst    Past Surgical History:  Procedure Laterality Date   HERNIA REPAIR     Patient Active Problem List   Diagnosis Date Noted   SVD (spontaneous vaginal delivery) 3/15 08/14/2017   Postpartum care following vaginal delivery 08/14/2017   Encounter for planned induction of labor 08/13/2017   Gestational HTN 08/13/2017    PCP: NA  REFERRING PROVIDER: Barbette Knock, MD  REFERRING DIAG: M62.89 (ICD-10-CM) - Other specified disorders of muscle   THERAPY DIAG:  Pelvic pain  Other low back pain  Lower abdominal pain  Other muscle spasm  Muscle weakness (generalized)  Unspecified lack of coordination  Abnormal posture  Rationale for Evaluation and Treatment: Rehabilitation  ONSET DATE: 09/30/23  SUBJECTIVE:                                                                                                                                                                                           SUBJECTIVE STATEMENT: Pt states that her low back pain is much better. She states that her pelvic/abdominal pain is still there, but only when she is on her period. She is having very frequent cycles since getting her IUD placed.    PAIN: 02/22/24 Are you having pain? Yes NPRS scale: 2/10 Pain location: pelvic pain  Pain type: aching Pain description: pelvis,  abdomen, low back   Aggravating factors: lifting, menstrual cycles, sitting in one position  Relieving factors: adjusting position, heating pad  PRECAUTIONS: None  RED FLAGS: None   WEIGHT BEARING RESTRICTIONS: No  FALLS:  Has patient fallen in last 6 months? 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   ACTIVITY LEVEL : very active at work; run/walks with group 1x/work  PLOF: Independent  PATIENT GOALS: to decrease pain  PERTINENT HISTORY:  Hernia repair 2002; 2 vaginal deliveries  Sexual abuse: No  BOWEL MOVEMENT: Pain with bowel movement:  No Type of bowel movement:Type (Bristol Stool Scale) 3-4, Frequency 2x/week, and Strain not usually  Fully empty rectum: Yes: sometimes  Leakage: No Pads: No Fiber supplement/laxative No  URINATION: Pain with urination: No Fully empty bladder: Yes:   Stream: hard to start Urgency: Yes  Frequency: 3-4x/day; not usually waking at night  Fluid Intake: not enough - only 16 oz at 4 today Leakage: Urge to void, Walking to the bathroom, Coughing, Sneezing, Laughing, Exercise, Lifting, and running Pads: No  INTERCOURSE:  Ability to have vaginal penetration Yes  Pain with intercourse: During Penetration, Deep Penetration, and refers up into Rt abdomen; aches after intercourse  DrynessNo Climax: pain with orgasms  Marinoff Scale: 0/3 Lubricant: not usually  PREGNANCY: Vaginal deliveries 2 Tearing No Episiotomy No C-section deliveries 0 Currently pregnant No  PROLAPSE: None   OBJECTIVE:  Note: Objective measures were completed at Evaluation unless otherwise noted.  01/11/24: PATIENT SURVEYS:   PFIQ-7: 49  COGNITION: Overall cognitive status: Within functional limits for tasks assessed     SENSATION: Light touch: Appears intact   FUNCTIONAL TESTS:  Squat: Rt weight shift and breath holding  Single leg stance:  Rt: stable  Lt: pelvic drop Curl-up test: abdominal distortion   GAIT: Assistive device  utilized: None Comments: WNL  POSTURE: rounded shoulders, forward head, anterior pelvic tilt, and Lt posterior rotation, Rt thoracic curvature, Lt lumbar curvature   LUMBARAROM/PROM:  A/PROM A/PROM  Eval (% available)  Flexion 100  Extension 100, pain in low back, stretching in abdomen  Right lateral flexion 100  Left lateral flexion 100  Right rotation 100  Left rotation 100   (Blank rows = not tested)  PALPATION:   General: tenderness and restriction in low back  Pelvic Alignment: Lt pelvic rotation   Abdominal: tenderness throughout lower abdomen; apical breathing pattern                 External Perineal Exam: some dryness and labial fusion                             Internal Pelvic Floor: tenderness throughout bil superficial and deep pelvic floor muscles   Patient confirms identification and approves PT to assess internal pelvic floor and treatment Yes  PELVIC MMT:   MMT eval  Vaginal 2/5, 5 second hold, 5 repeat contractions with poor coordination  Diastasis Recti 2 finger widths  (Blank rows = not tested)        TONE: High bil superficial nd deep pelvic floor muscles   PROLAPSE: Grade 1 anterior vaginal wall laxity; grade 1 uterine ligament laxity  TODAY'S TREATMENT:                                                                                                                              DATE:  02/22/24 Manual: Pt provides verbal consent for internal vaginal/rectal pelvic floor exam. Internal vaginal pelvic  floor muscle release bil superficial and deep pelvic floor muscles Lower abdominal soft tissue mobilization  Neuromuscular re-education: Diaphragmatic breathing for improved pelvic floor muscle relaxation and lengthening   01/11/24 EVAL  Neuromuscular re-education: Diaphragmatic breathing for pelvic floor muscle relaxation and improved mobility Down training HEP Cat cow Child's pose Happy baby Butterfly    PATIENT EDUCATION:  Education  details: See above Person educated: Patient Education method: Explanation, Demonstration, Tactile cues, Verbal cues, and Handouts Education comprehension: verbalized understanding  HOME EXERCISE PROGRAM: [id number]  ASSESSMENT:  CLINICAL IMPRESSION: Patient is a 38 y.o. female who was seen today for physical therapy evaluation and treatment for pelvic/low back/abdominal pain, constipation, and urinary incontinence. Pt reports good improvements in low back pain. She is still having lower abdominal/pelvic pain that is worse with menstrual cycles. We returned to internal pelvic floor muscle exam and worked to release tension out of superficial and deep pelvic floor muscles bil. She tolerated all manual techniques well and was able to utilize diaphragmatic breathing to help further release restriction that reproduced painful symptoms. She will continue to benefit from skilled PT intervention in order to decrease pain, improve constipation, decrease urinary incontinence, and address impairments.    OBJECTIVE IMPAIRMENTS: decreased activity tolerance, decreased coordination, decreased endurance, decreased mobility, decreased ROM, decreased strength, increased fascial restrictions, increased muscle spasms, impaired flexibility, impaired tone, improper body mechanics, postural dysfunction, and pain.   ACTIVITY LIMITATIONS: lifting, squatting, and continence  PARTICIPATION LIMITATIONS: cleaning, interpersonal relationship, driving, community activity, occupation, and exercise  PERSONAL FACTORS: 1-2 comorbidities: medical history are also affecting patient's functional outcome.   REHAB POTENTIAL: Good  CLINICAL DECISION MAKING: Evolving/moderate complexity  EVALUATION COMPLEXITY: Moderate   GOALS: Goals reviewed with patient? Yes  SHORT TERM GOALS: Target date: 02/08/2024   Pt will be independent with HEP in order to improve activity tolerance.   Baseline: Goal status: MET 02/22/24  2.   Patient will report 25% improve in pelvic pain in order to increase activity tolerance.    Baseline: 9/10 at worst Goal status: IN PROGRESS 02/22/24  3.  Pt will be independent with use of squatty potty, relaxed toileting mechanics, and improved bowel movement techniques in order to increase ease of bowel movements and complete evacuation.    Baseline: not using Goal status:  IN PROGRESS 02/22/24  4.  Pt will be independent with the knack, urge suppression technique, and double voiding in order to improve bladder habits and decrease urinary incontinence.   Baseline: not using Goal status:  IN PROGRESS 02/22/24  5.  Pt will be able to fully relax and contract pelvic floor in order to improve pelvic pain and urinary incontinence to work with less difficulty Baseline: high tone and poor coordination with contract/relax Goal status:  IN PROGRESS 02/22/24  6.  Pt will be able to correctly perform diaphragmatic breathing and appropriate pressure management in order to prevent worsening vaginal wall/uterine ligament laxity and improve pelvic floor A/ROM.   Baseline: uterine ligament and anterior vaginal wall laxity Goal status:  IN PROGRESS 02/22/24  LONG TERM GOALS: Target date: 06/27/2024  Pt will be independent with advanced HEP in order to improve activity tolerance.   Baseline:  Goal status:  IN PROGRESS 02/22/24  2.  Patient will report 75% improve in pelvic pain in order to increase activity tolerance.   Baseline: 9/10 at worst Goal status:  IN PROGRESS 02/22/24  3.  Pt will be able to go 2-3 hours in between voids without urgency or incontinence in  order to improve QOL and perform all functional activities with less difficulty.   Baseline: strong urgency and will leak on the way to the bathroom Goal status:  IN PROGRESS 02/22/24  4.  Pt will be able to laugh, cough, sneeze, jump, lift, and run without urinary incontinence.  Baseline: leaking with all Goal status:  IN PROGRESS  02/22/24  5.  Patient will be able to perform single leg stance with good pelvic stability in order to demonstrate appropriate pelvic floor muscle and transversus abdominus strength and coordination in order to decrease urinary incontinence and improve pain.   Baseline:  Goal status:  IN PROGRESS 02/22/24  6.  Pt will report 0/10 pain with vaginal penetration in order to improve intimate relationship with partner.    Baseline: pain that stops intercourse Goal status:  IN PROGRESS 02/22/24  PLAN:  PT FREQUENCY: 1-2x/week  PT DURATION: 16 visits    PLANNED INTERVENTIONS: 97164- PT Re-evaluation, 97110-Therapeutic exercises, 97530- Therapeutic activity, 97112- Neuromuscular re-education, 97535- Self Care, 02859- Manual therapy, [phone]- Gait training, [phone]- Aquatic Therapy, [phone]- Electrical stimulation (unattended), [phone]- Traction (mechanical), [phone]- Ionotophoresis 4mg /ml Dexamethasone, 79439 (1-2 muscles), 20561 (3+ muscles)- Dry Needling, Patient/Family education, Balance training, Taping, Joint mobilization, Joint manipulation, Spinal manipulation, Spinal mobilization, Scar mobilization, Vestibular training, Cryotherapy, Moist heat, and Biofeedback  PLAN FOR NEXT SESSION: begin pelvic floor muscle release to superficial/deep musculature vaginally; manual techniques to abdomen and low back; progress mobility activities and down training; begin pelvic stability exercises; bladder habits; bowel habits   Josette Mares, PT, DPT09/23/253:31 PM

## 2024-02-28 ENCOUNTER — Ambulatory Visit

## 2024-02-28 DIAGNOSIS — R102 Pelvic and perineal pain: Secondary | ICD-10-CM

## 2024-02-28 DIAGNOSIS — M6281 Muscle weakness (generalized): Secondary | ICD-10-CM

## 2024-02-28 DIAGNOSIS — R293 Abnormal posture: Secondary | ICD-10-CM

## 2024-02-28 DIAGNOSIS — M5459 Other low back pain: Secondary | ICD-10-CM

## 2024-02-28 DIAGNOSIS — M62838 Other muscle spasm: Secondary | ICD-10-CM

## 2024-02-28 DIAGNOSIS — R1030 Lower abdominal pain, unspecified: Secondary | ICD-10-CM

## 2024-02-28 DIAGNOSIS — R279 Unspecified lack of coordination: Secondary | ICD-10-CM

## 2024-02-28 NOTE — Therapy (Signed)
 OUTPATIENT PHYSICAL THERAPY FEMALE PELVIC TREATMENT   Patient Name: Adriana Petersen MRN: [id number] DOB:12/21/1985, 38 y.o., female Today's Date: 02/28/2024  END OF SESSION:  PT End of Session - 02/28/24 1448     Visit Number 3    Date for Recertification  06/27/24    Authorization Type BCBS    PT Start Time 1448    PT Stop Time 1527    PT Time Calculation (min) 39 min    Activity Tolerance Patient tolerated treatment well    Behavior During Therapy [facility] for tasks assessed/performed           Past Medical History:  Diagnosis Date   Anxiety    Depression    Pregnancy induced hypertension    Thyroid cyst    Past Surgical History:  Procedure Laterality Date   HERNIA REPAIR     Patient Active Problem List   Diagnosis Date Noted   SVD (spontaneous vaginal delivery) 3/15 08/14/2017   Postpartum care following vaginal delivery 08/14/2017   Encounter for planned induction of labor 08/13/2017   Gestational HTN 08/13/2017    PCP: NA  REFERRING PROVIDER: Barbette Knock, MD  REFERRING DIAG: M62.89 (ICD-10-CM) - Other specified disorders of muscle   THERAPY DIAG:  Pelvic pain  Other low back pain  Lower abdominal pain  Other muscle spasm  Muscle weakness (generalized)  Unspecified lack of coordination  Abnormal posture  Rationale for Evaluation and Treatment: Rehabilitation  ONSET DATE: 09/30/23  SUBJECTIVE:                                                                                                                                                                                           SUBJECTIVE STATEMENT: Pt states that she did well after last treatment session. She has been trying to stretch as much as possible. She feels like last session was helpful.    PAIN: 02/28/24 Are you having pain? Yes NPRS scale: 2/10 Pain location: pelvic pain  Pain type: aching Pain description: pelvis, abdomen, low back   Aggravating factors: lifting, menstrual  cycles, sitting in one position  Relieving factors: adjusting position, heating pad  PRECAUTIONS: None  RED FLAGS: None   WEIGHT BEARING RESTRICTIONS: No  FALLS:  Has patient fallen in last 6 months? 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   ACTIVITY LEVEL : very active at work; run/walks with group 1x/work  PLOF: Independent  PATIENT GOALS: to decrease pain  PERTINENT HISTORY:  Hernia repair 2002; 2 vaginal deliveries  Sexual abuse: No  BOWEL MOVEMENT: Pain with bowel movement: No Type of bowel movement:Type (Bristol Stool Scale) 3-4, Frequency 2x/week,  and Strain not usually  Fully empty rectum: Yes: sometimes  Leakage: No Pads: No Fiber supplement/laxative No  URINATION: Pain with urination: No Fully empty bladder: Yes:   Stream: hard to start Urgency: Yes  Frequency: 3-4x/day; not usually waking at night  Fluid Intake: not enough - only 16 oz at 4 today Leakage: Urge to void, Walking to the bathroom, Coughing, Sneezing, Laughing, Exercise, Lifting, and running Pads: No  INTERCOURSE:  Ability to have vaginal penetration Yes  Pain with intercourse: During Penetration, Deep Penetration, and refers up into Rt abdomen; aches after intercourse  DrynessNo Climax: pain with orgasms  Marinoff Scale: 0/3 Lubricant: not usually  PREGNANCY: Vaginal deliveries 2 Tearing No Episiotomy No C-section deliveries 0 Currently pregnant No  PROLAPSE: None   OBJECTIVE:  Note: Objective measures were completed at Evaluation unless otherwise noted.  01/11/24: PATIENT SURVEYS:   PFIQ-7: 55  COGNITION: Overall cognitive status: Within functional limits for tasks assessed     SENSATION: Light touch: Appears intact   FUNCTIONAL TESTS:  Squat: Rt weight shift and breath holding  Single leg stance:  Rt: stable  Lt: pelvic drop Curl-up test: abdominal distortion   GAIT: Assistive device utilized: None Comments: WNL  POSTURE: rounded shoulders,  forward head, anterior pelvic tilt, and Lt posterior rotation, Rt thoracic curvature, Lt lumbar curvature   LUMBARAROM/PROM:  A/PROM A/PROM  Eval (% available)  Flexion 100  Extension 100, pain in low back, stretching in abdomen  Right lateral flexion 100  Left lateral flexion 100  Right rotation 100  Left rotation 100   (Blank rows = not tested)  PALPATION:   General: tenderness and restriction in low back  Pelvic Alignment: Lt pelvic rotation   Abdominal: tenderness throughout lower abdomen; apical breathing pattern                 External Perineal Exam: some dryness and labial fusion                             Internal Pelvic Floor: tenderness throughout bil superficial and deep pelvic floor muscles   Patient confirms identification and approves PT to assess internal pelvic floor and treatment Yes  PELVIC MMT:   MMT eval  Vaginal 2/5, 5 second hold, 5 repeat contractions with poor coordination  Diastasis Recti 2 finger widths  (Blank rows = not tested)        TONE: High bil superficial nd deep pelvic floor muscles   PROLAPSE: Grade 1 anterior vaginal wall laxity; grade 1 uterine ligament laxity  TODAY'S TREATMENT:                                                                                                                              DATE:  02/28/24 Manual: Pt provides verbal consent for internal vaginal/rectal pelvic floor exam. Internal vaginal pelvic floor muscle release bil superficial and deep pelvic floor muscles Lower  abdominal soft tissue mobilization  Neuromuscular re-education: Diaphragmatic breathing for improved pelvic floor muscle relaxation and lengthening    02/22/24 Manual: Pt provides verbal consent for internal vaginal/rectal pelvic floor exam. Internal vaginal pelvic floor muscle release bil superficial and deep pelvic floor muscles Lower abdominal soft tissue mobilization  Neuromuscular re-education: Diaphragmatic breathing for  improved pelvic floor muscle relaxation and lengthening   01/11/24 EVAL  Neuromuscular re-education: Diaphragmatic breathing for pelvic floor muscle relaxation and improved mobility Down training HEP Cat cow Child's pose Happy baby Butterfly    PATIENT EDUCATION:  Education details: See above Person educated: Patient Education method: Explanation, Demonstration, Tactile cues, Verbal cues, and Handouts Education comprehension: verbalized understanding  HOME EXERCISE PROGRAM: [id number]  ASSESSMENT:  CLINICAL IMPRESSION: Patient is a 38 y.o. female who was seen today for physical therapy evaluation and treatment for pelvic/low back/abdominal pain, constipation, and urinary incontinence. Pt doing very well overall with improved pain after last session. She is about to get menstrual cycle and reports that it is painful but a little less so than typically. Pt demonstrates continued restriction in pelvic floor, more localized in Lt levator ani. We continued manual techniques to abdomen as well and she also has tightness in abdomen that is producing achiness. Good tolerance to all treatment. She will continue to benefit from skilled PT intervention in order to decrease pain, improve constipation, decrease urinary incontinence, and address impairments.    OBJECTIVE IMPAIRMENTS: decreased activity tolerance, decreased coordination, decreased endurance, decreased mobility, decreased ROM, decreased strength, increased fascial restrictions, increased muscle spasms, impaired flexibility, impaired tone, improper body mechanics, postural dysfunction, and pain.   ACTIVITY LIMITATIONS: lifting, squatting, and continence  PARTICIPATION LIMITATIONS: cleaning, interpersonal relationship, driving, community activity, occupation, and exercise  PERSONAL FACTORS: 1-2 comorbidities: medical history are also affecting patient's functional outcome.   REHAB POTENTIAL: Good  CLINICAL DECISION MAKING:  Evolving/moderate complexity  EVALUATION COMPLEXITY: Moderate   GOALS: Goals reviewed with patient? Yes  SHORT TERM GOALS: Target date: 02/08/2024   Pt will be independent with HEP in order to improve activity tolerance.   Baseline: Goal status: MET 02/22/24  2.  Patient will report 25% improve in pelvic pain in order to increase activity tolerance.    Baseline: 9/10 at worst Goal status: IN PROGRESS 02/22/24  3.  Pt will be independent with use of squatty potty, relaxed toileting mechanics, and improved bowel movement techniques in order to increase ease of bowel movements and complete evacuation.    Baseline: not using Goal status:  IN PROGRESS 02/22/24  4.  Pt will be independent with the knack, urge suppression technique, and double voiding in order to improve bladder habits and decrease urinary incontinence.   Baseline: not using Goal status:  IN PROGRESS 02/22/24  5.  Pt will be able to fully relax and contract pelvic floor in order to improve pelvic pain and urinary incontinence to work with less difficulty Baseline: high tone and poor coordination with contract/relax Goal status:  IN PROGRESS 02/22/24  6.  Pt will be able to correctly perform diaphragmatic breathing and appropriate pressure management in order to prevent worsening vaginal wall/uterine ligament laxity and improve pelvic floor A/ROM.   Baseline: uterine ligament and anterior vaginal wall laxity Goal status:  IN PROGRESS 02/22/24  LONG TERM GOALS: Target date: 06/27/2024  Pt will be independent with advanced HEP in order to improve activity tolerance.   Baseline:  Goal status:  IN PROGRESS 02/22/24  2.  Patient will report 75% improve in pelvic  pain in order to increase activity tolerance.   Baseline: 9/10 at worst Goal status:  IN PROGRESS 02/22/24  3.  Pt will be able to go 2-3 hours in between voids without urgency or incontinence in order to improve QOL and perform all functional activities with  less difficulty.   Baseline: strong urgency and will leak on the way to the bathroom Goal status:  IN PROGRESS 02/22/24  4.  Pt will be able to laugh, cough, sneeze, jump, lift, and run without urinary incontinence.  Baseline: leaking with all Goal status:  IN PROGRESS 02/22/24  5.  Patient will be able to perform single leg stance with good pelvic stability in order to demonstrate appropriate pelvic floor muscle and transversus abdominus strength and coordination in order to decrease urinary incontinence and improve pain.   Baseline:  Goal status:  IN PROGRESS 02/22/24  6.  Pt will report 0/10 pain with vaginal penetration in order to improve intimate relationship with partner.    Baseline: pain that stops intercourse Goal status:  IN PROGRESS 02/22/24  PLAN:  PT FREQUENCY: 1-2x/week  PT DURATION: 16 visits    PLANNED INTERVENTIONS: 97164- PT Re-evaluation, 97110-Therapeutic exercises, 97530- Therapeutic activity, 97112- Neuromuscular re-education, 97535- Self Care, 02859- Manual therapy, [phone]- Gait training, [phone]- Aquatic Therapy, [phone]- Electrical stimulation (unattended), [phone]- Traction (mechanical), [phone]- Ionotophoresis 4mg /ml Dexamethasone, 79439 (1-2 muscles), 20561 (3+ muscles)- Dry Needling, Patient/Family education, Balance training, Taping, Joint mobilization, Joint manipulation, Spinal manipulation, Spinal mobilization, Scar mobilization, Vestibular training, Cryotherapy, Moist heat, and Biofeedback  PLAN FOR NEXT SESSION: begin pelvic floor muscle release to superficial/deep musculature vaginally; manual techniques to abdomen and low back; progress mobility activities and down training; begin pelvic stability exercises; bladder habits; bowel habits   Josette Mares, PT, DPT09/29/253:29 PM

## 2024-03-08 ENCOUNTER — Ambulatory Visit: Attending: Obstetrics & Gynecology

## 2024-03-08 DIAGNOSIS — M6281 Muscle weakness (generalized): Secondary | ICD-10-CM | POA: Diagnosis present

## 2024-03-08 DIAGNOSIS — R279 Unspecified lack of coordination: Secondary | ICD-10-CM | POA: Diagnosis present

## 2024-03-08 DIAGNOSIS — M62838 Other muscle spasm: Secondary | ICD-10-CM | POA: Insufficient documentation

## 2024-03-08 DIAGNOSIS — R293 Abnormal posture: Secondary | ICD-10-CM | POA: Diagnosis present

## 2024-03-08 DIAGNOSIS — M5459 Other low back pain: Secondary | ICD-10-CM | POA: Insufficient documentation

## 2024-03-08 DIAGNOSIS — R1030 Lower abdominal pain, unspecified: Secondary | ICD-10-CM | POA: Diagnosis present

## 2024-03-08 DIAGNOSIS — R102 Pelvic and perineal pain unspecified side: Secondary | ICD-10-CM | POA: Insufficient documentation

## 2024-03-08 NOTE — Therapy (Signed)
 OUTPATIENT PHYSICAL THERAPY FEMALE PELVIC TREATMENT   Patient Name: Adriana Petersen MRN: [id number] DOB:[birth year], 38 y.o., female Today's Date: 03/08/2024  END OF SESSION:  PT End of Session - 03/08/24 1452     Visit Number 4    Date for Recertification  06/27/24    Authorization Type BCBS    PT Start Time 1447    PT Stop Time 1527    PT Time Calculation (min) 40 min    Activity Tolerance Patient tolerated treatment well    Behavior During Therapy [facility] for tasks assessed/performed           Past Medical History:  Diagnosis Date   Anxiety    Depression    Pregnancy induced hypertension    Thyroid cyst    Past Surgical History:  Procedure Laterality Date   HERNIA REPAIR     Patient Active Problem List   Diagnosis Date Noted   SVD (spontaneous vaginal delivery) 3/15 08/14/2017   Postpartum care following vaginal delivery 08/14/2017   Encounter for planned induction of labor 08/13/2017   Gestational HTN 08/13/2017    PCP: NA  REFERRING PROVIDER: Barbette Knock, MD  REFERRING DIAG: M62.89 (ICD-10-CM) - Other specified disorders of muscle   THERAPY DIAG:  Pelvic pain  Other low back pain  Lower abdominal pain  Other muscle spasm  Muscle weakness (generalized)  Unspecified lack of coordination  Abnormal posture  Rationale for Evaluation and Treatment: Rehabilitation  ONSET DATE: 09/30/23  SUBJECTIVE:                                                                                                                                                                                           SUBJECTIVE STATEMENT: Pt states that she feels like she is seeing improvement in pain.    PAIN: 03/08/24 Are you having pain? Yes NPRS scale: 2/10 Pain location: pelvic pain  Pain type: aching Pain description: pelvis, abdomen, low back   Aggravating factors: lifting, menstrual cycles, sitting in one position  Relieving factors: adjusting position, heating  pad  PRECAUTIONS: None  RED FLAGS: None   WEIGHT BEARING RESTRICTIONS: No  FALLS:  Has patient fallen in last 6 months? 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   ACTIVITY LEVEL : very active at work; run/walks with group 1x/work  PLOF: Independent  PATIENT GOALS: to decrease pain  PERTINENT HISTORY:  Hernia repair 2002; 2 vaginal deliveries  Sexual abuse: No  BOWEL MOVEMENT: Pain with bowel movement: No Type of bowel movement:Type (Bristol Stool Scale) 3-4, Frequency 2x/week, and Strain not usually  Fully empty rectum: Yes: sometimes  Leakage: No Pads: No  Fiber supplement/laxative No  URINATION: Pain with urination: No Fully empty bladder: Yes:   Stream: hard to start Urgency: Yes  Frequency: 3-4x/day; not usually waking at night  Fluid Intake: not enough - only 16 oz at 4 today Leakage: Urge to void, Walking to the bathroom, Coughing, Sneezing, Laughing, Exercise, Lifting, and running Pads: No  INTERCOURSE:  Ability to have vaginal penetration Yes  Pain with intercourse: During Penetration, Deep Penetration, and refers up into Rt abdomen; aches after intercourse  DrynessNo Climax: pain with orgasms  Marinoff Scale: 0/3 Lubricant: not usually  PREGNANCY: Vaginal deliveries 2 Tearing No Episiotomy No C-section deliveries 0 Currently pregnant No  PROLAPSE: None   OBJECTIVE:  Note: Objective measures were completed at Evaluation unless otherwise noted.  01/11/24: PATIENT SURVEYS:   PFIQ-7: 8  COGNITION: Overall cognitive status: Within functional limits for tasks assessed     SENSATION: Light touch: Appears intact   FUNCTIONAL TESTS:  Squat: Rt weight shift and breath holding  Single leg stance:  Rt: stable  Lt: pelvic drop Curl-up test: abdominal distortion   GAIT: Assistive device utilized: None Comments: WNL  POSTURE: rounded shoulders, forward head, anterior pelvic tilt, and Lt posterior rotation, Rt thoracic  curvature, Lt lumbar curvature   LUMBARAROM/PROM:  A/PROM A/PROM  Eval (% available)  Flexion 100  Extension 100, pain in low back, stretching in abdomen  Right lateral flexion 100  Left lateral flexion 100  Right rotation 100  Left rotation 100   (Blank rows = not tested)  PALPATION:   General: tenderness and restriction in low back  Pelvic Alignment: Lt pelvic rotation   Abdominal: tenderness throughout lower abdomen; apical breathing pattern                 External Perineal Exam: some dryness and labial fusion                             Internal Pelvic Floor: tenderness throughout bil superficial and deep pelvic floor muscles   Patient confirms identification and approves PT to assess internal pelvic floor and treatment Yes  PELVIC MMT:   MMT eval  Vaginal 2/5, 5 second hold, 5 repeat contractions with poor coordination  Diastasis Recti 2 finger widths  (Blank rows = not tested)        TONE: High bil superficial nd deep pelvic floor muscles   PROLAPSE: Grade 1 anterior vaginal wall laxity; grade 1 uterine ligament laxity  TODAY'S TREATMENT:                                                                                                                              DATE:  03/08/24 Neuromuscular re-education: Transversus abdominus training with multimodal cues for improved motor control and breath coordination Supine hip adduction ball press with transversus abdominus and pelvic floor muscle contractions and breath coordination 10x Bridge with hip adduction, transversus abdominus, and pelvic  floor muscle 2 x 10 Supine leg extension 10x bil Exercises: Open books 10x bil  Wide foot lower trunk rotation 2 x 10  02/28/24 Manual: Pt provides verbal consent for internal vaginal/rectal pelvic floor exam. Internal vaginal pelvic floor muscle release bil superficial and deep pelvic floor muscles Lower abdominal soft tissue mobilization  Neuromuscular  re-education: Diaphragmatic breathing for improved pelvic floor muscle relaxation and lengthening    02/22/24 Manual: Pt provides verbal consent for internal vaginal/rectal pelvic floor exam. Internal vaginal pelvic floor muscle release bil superficial and deep pelvic floor muscles Lower abdominal soft tissue mobilization  Neuromuscular re-education: Diaphragmatic breathing for improved pelvic floor muscle relaxation and lengthening    PATIENT EDUCATION:  Education details: See above Person educated: Patient Education method: Programmer, multimedia, Demonstration, Tactile cues, Verbal cues, and Handouts Education comprehension: verbalized understanding  HOME EXERCISE PROGRAM: [id number]  ASSESSMENT:  CLINICAL IMPRESSION: Patient is a 38 y.o. female who was seen today for physical therapy treatment for pelvic/low back/abdominal pain, constipation, and urinary incontinence. Pt doing much better with pain, even during menstrual cycle. Due to this improvement in pain, we decided to focus more on core strengthening and coordination activities in order to provide improved mobility and strength to pelvic floor muscles. Pt demonstrates difficult time She will continue to benefit from skilled PT intervention in order to decrease pain, improve constipation, decrease urinary incontinence, and address impairments.    OBJECTIVE IMPAIRMENTS: decreased activity tolerance, decreased coordination, decreased endurance, decreased mobility, decreased ROM, decreased strength, increased fascial restrictions, increased muscle spasms, impaired flexibility, impaired tone, improper body mechanics, postural dysfunction, and pain.   ACTIVITY LIMITATIONS: lifting, squatting, and continence  PARTICIPATION LIMITATIONS: cleaning, interpersonal relationship, driving, community activity, occupation, and exercise  PERSONAL FACTORS: 1-2 comorbidities: medical history are also affecting patient's functional outcome.   REHAB  POTENTIAL: Good  CLINICAL DECISION MAKING: Evolving/moderate complexity  EVALUATION COMPLEXITY: Moderate   GOALS: Goals reviewed with patient? Yes  SHORT TERM GOALS: Target date: 02/08/2024   Pt will be independent with HEP in order to improve activity tolerance.   Baseline: Goal status: MET 02/22/24  2.  Patient will report 25% improve in pelvic pain in order to increase activity tolerance.    Baseline: 9/10 at worst Goal status: IN PROGRESS 02/22/24  3.  Pt will be independent with use of squatty potty, relaxed toileting mechanics, and improved bowel movement techniques in order to increase ease of bowel movements and complete evacuation.    Baseline: not using Goal status:  IN PROGRESS 02/22/24  4.  Pt will be independent with the knack, urge suppression technique, and double voiding in order to improve bladder habits and decrease urinary incontinence.   Baseline: not using Goal status:  IN PROGRESS 02/22/24  5.  Pt will be able to fully relax and contract pelvic floor in order to improve pelvic pain and urinary incontinence to work with less difficulty Baseline: high tone and poor coordination with contract/relax Goal status:  IN PROGRESS 02/22/24  6.  Pt will be able to correctly perform diaphragmatic breathing and appropriate pressure management in order to prevent worsening vaginal wall/uterine ligament laxity and improve pelvic floor A/ROM.   Baseline: uterine ligament and anterior vaginal wall laxity Goal status:  IN PROGRESS 02/22/24  LONG TERM GOALS: Target date: 06/27/2024  Pt will be independent with advanced HEP in order to improve activity tolerance.   Baseline:  Goal status:  IN PROGRESS 02/22/24  2.  Patient will report 75% improve in pelvic pain in order  to increase activity tolerance.   Baseline: 9/10 at worst Goal status:  IN PROGRESS 02/22/24  3.  Pt will be able to go 2-3 hours in between voids without urgency or incontinence in order to improve QOL  and perform all functional activities with less difficulty.   Baseline: strong urgency and will leak on the way to the bathroom Goal status:  IN PROGRESS 02/22/24  4.  Pt will be able to laugh, cough, sneeze, jump, lift, and run without urinary incontinence.  Baseline: leaking with all Goal status:  IN PROGRESS 02/22/24  5.  Patient will be able to perform single leg stance with good pelvic stability in order to demonstrate appropriate pelvic floor muscle and transversus abdominus strength and coordination in order to decrease urinary incontinence and improve pain.   Baseline:  Goal status:  IN PROGRESS 02/22/24  6.  Pt will report 0/10 pain with vaginal penetration in order to improve intimate relationship with partner.    Baseline: pain that stops intercourse Goal status:  IN PROGRESS 02/22/24  PLAN:  PT FREQUENCY: 1-2x/week  PT DURATION: 16 visits    PLANNED INTERVENTIONS: 97164- PT Re-evaluation, 97110-Therapeutic exercises, 97530- Therapeutic activity, 97112- Neuromuscular re-education, 97535- Self Care, [id number]- Manual therapy, [phone]- Gait training, [phone]- Aquatic Therapy, [phone]- Electrical stimulation (unattended), [phone]- Traction (mechanical), [id number]- Ionotophoresis 4mg /ml Dexamethasone, 79439 (1-2 muscles), 20561 (3+ muscles)- Dry Needling, Patient/Family education, Balance training, Taping, Joint mobilization, Joint manipulation, Spinal manipulation, Spinal mobilization, Scar mobilization, Vestibular training, Cryotherapy, Moist heat, and Biofeedback  PLAN FOR NEXT SESSION: begin pelvic floor muscle release to superficial/deep musculature vaginally; manual techniques to abdomen and low back; progress mobility activities and down training; begin pelvic stability exercises; bladder habits; bowel habits   Josette Mares, PT, DPT10/08/253:28 PM

## 2024-03-15 ENCOUNTER — Ambulatory Visit

## 2024-03-15 DIAGNOSIS — M62838 Other muscle spasm: Secondary | ICD-10-CM

## 2024-03-15 DIAGNOSIS — R293 Abnormal posture: Secondary | ICD-10-CM

## 2024-03-15 DIAGNOSIS — R102 Pelvic and perineal pain unspecified side: Secondary | ICD-10-CM | POA: Diagnosis not present

## 2024-03-15 DIAGNOSIS — M5459 Other low back pain: Secondary | ICD-10-CM

## 2024-03-15 DIAGNOSIS — M6281 Muscle weakness (generalized): Secondary | ICD-10-CM

## 2024-03-15 DIAGNOSIS — R279 Unspecified lack of coordination: Secondary | ICD-10-CM

## 2024-03-15 DIAGNOSIS — R1030 Lower abdominal pain, unspecified: Secondary | ICD-10-CM

## 2024-03-15 NOTE — Therapy (Signed)
 OUTPATIENT PHYSICAL THERAPY FEMALE PELVIC TREATMENT   Patient Name: Adriana Petersen MRN: [id number] DOB:08-27-1985, 38 y.o., female Today's Date: 03/15/2024  END OF SESSION:  PT End of Session - 03/15/24 1404     Visit Number 5    Date for Recertification  06/27/24    Authorization Type BCBS    PT Start Time 1403    PT Stop Time 1441    PT Time Calculation (min) 38 min    Activity Tolerance Patient tolerated treatment well    Behavior During Therapy [facility] for tasks assessed/performed           Past Medical History:  Diagnosis Date   Anxiety    Depression    Pregnancy induced hypertension    Thyroid cyst    Past Surgical History:  Procedure Laterality Date   HERNIA REPAIR     Patient Active Problem List   Diagnosis Date Noted   SVD (spontaneous vaginal delivery) 3/15 08/14/2017   Postpartum care following vaginal delivery 08/14/2017   Encounter for planned induction of labor 08/13/2017   Gestational HTN 08/13/2017    PCP: NA  REFERRING PROVIDER: Barbette Knock, MD  REFERRING DIAG: M62.89 (ICD-10-CM) - Other specified disorders of muscle   THERAPY DIAG:  Pelvic pain  Other low back pain  Lower abdominal pain  Other muscle spasm  Muscle weakness (generalized)  Unspecified lack of coordination  Abnormal posture  Rationale for Evaluation and Treatment: Rehabilitation  ONSET DATE: 09/30/23  SUBJECTIVE:                                                                                                                                                                                           SUBJECTIVE STATEMENT: Pt states that her pain has been much lower this week.    PAIN: 03/15/24 Are you having pain? Yes NPRS scale: 1-2/10 Pain location: pelvic pain  Pain type: aching Pain description: pelvis, abdomen, low back   Aggravating factors: lifting, menstrual cycles, sitting in one position  Relieving factors: adjusting position, heating  pad  PRECAUTIONS: None  RED FLAGS: None   WEIGHT BEARING RESTRICTIONS: No  FALLS:  Has patient fallen in last 6 months? 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   ACTIVITY LEVEL : very active at work; run/walks with group 1x/work  PLOF: Independent  PATIENT GOALS: to decrease pain  PERTINENT HISTORY:  Hernia repair 2002; 2 vaginal deliveries  Sexual abuse: No  BOWEL MOVEMENT: Pain with bowel movement: No Type of bowel movement:Type (Bristol Stool Scale) 3-4, Frequency 2x/week, and Strain not usually  Fully empty rectum: Yes: sometimes  Leakage: No Pads: No Fiber  supplement/laxative No  URINATION: Pain with urination: No Fully empty bladder: Yes:   Stream: hard to start Urgency: Yes  Frequency: 3-4x/day; not usually waking at night  Fluid Intake: not enough - only 16 oz at 4 today Leakage: Urge to void, Walking to the bathroom, Coughing, Sneezing, Laughing, Exercise, Lifting, and running Pads: No  INTERCOURSE:  Ability to have vaginal penetration Yes  Pain with intercourse: During Penetration, Deep Penetration, and refers up into Rt abdomen; aches after intercourse  DrynessNo Climax: pain with orgasms  Marinoff Scale: 0/3 Lubricant: not usually  PREGNANCY: Vaginal deliveries 2 Tearing No Episiotomy No C-section deliveries 0 Currently pregnant No  PROLAPSE: None   OBJECTIVE:  Note: Objective measures were completed at Evaluation unless otherwise noted.  01/11/24: PATIENT SURVEYS:   PFIQ-7: 14  COGNITION: Overall cognitive status: Within functional limits for tasks assessed     SENSATION: Light touch: Appears intact   FUNCTIONAL TESTS:  Squat: Rt weight shift and breath holding  Single leg stance:  Rt: stable  Lt: pelvic drop Curl-up test: abdominal distortion   GAIT: Assistive device utilized: None Comments: WNL  POSTURE: rounded shoulders, forward head, anterior pelvic tilt, and Lt posterior rotation, Rt thoracic  curvature, Lt lumbar curvature   LUMBARAROM/PROM:  A/PROM A/PROM  Eval (% available)  Flexion 100  Extension 100, pain in low back, stretching in abdomen  Right lateral flexion 100  Left lateral flexion 100  Right rotation 100  Left rotation 100   (Blank rows = not tested)  PALPATION:   General: tenderness and restriction in low back  Pelvic Alignment: Lt pelvic rotation   Abdominal: tenderness throughout lower abdomen; apical breathing pattern                 External Perineal Exam: some dryness and labial fusion                             Internal Pelvic Floor: tenderness throughout bil superficial and deep pelvic floor muscles   Patient confirms identification and approves PT to assess internal pelvic floor and treatment Yes  PELVIC MMT:   MMT eval  Vaginal 2/5, 5 second hold, 5 repeat contractions with poor coordination  Diastasis Recti 2 finger widths  (Blank rows = not tested)        TONE: High bil superficial nd deep pelvic floor muscles   PROLAPSE: Grade 1 anterior vaginal wall laxity; grade 1 uterine ligament laxity  TODAY'S TREATMENT:                                                                                                                              DATE:  03/15/24 Neuromuscular re-education: Modified dead bug 10x bil Bridge with hip adduction, transversus abdominus, and pelvic floor muscle 2 x 10 Lower trunk rotation with elevated LE on red ball 2 x 10 Full shoulder flexion + 5 lbs + elevated LE  red ball 2 x 10 Supine D2 + 5 lbs + elevated LE red ball 10x bil Cat cow 2 x 10 Bird dog row 10x bil 5 lbs   03/08/24 Neuromuscular re-education: Transversus abdominus training with multimodal cues for improved motor control and breath coordination Supine hip adduction ball press with transversus abdominus and pelvic floor muscle contractions and breath coordination 10x Bridge with hip adduction, transversus abdominus, and pelvic floor muscle 2 x  10 Supine leg extension 10x bil Exercises: Open books 10x bil Wide foot lower trunk rotation 2 x 10  02/28/24 Manual: Pt provides verbal consent for internal vaginal/rectal pelvic floor exam. Internal vaginal pelvic floor muscle release bil superficial and deep pelvic floor muscles Lower abdominal soft tissue mobilization  Neuromuscular re-education: Diaphragmatic breathing for improved pelvic floor muscle relaxation and lengthening      PATIENT EDUCATION:  Education details: See above Person educated: Patient Education method: Programmer, multimedia, Demonstration, Tactile cues, Verbal cues, and Handouts Education comprehension: verbalized understanding  HOME EXERCISE PROGRAM: [id number]  ASSESSMENT:  CLINICAL IMPRESSION: Patient is a 38 y.o. female who was seen today for physical therapy treatment for pelvic/low back/abdominal pain, constipation, and urinary incontinence. Pt doing very well with pain and has continued to have a good week overall. She did well with exercise progressions today to help facilitate and relax pelvic floor muscles. She will continue to benefit from skilled PT intervention in order to decrease pain, improve constipation, decrease urinary incontinence, and address impairments.    OBJECTIVE IMPAIRMENTS: decreased activity tolerance, decreased coordination, decreased endurance, decreased mobility, decreased ROM, decreased strength, increased fascial restrictions, increased muscle spasms, impaired flexibility, impaired tone, improper body mechanics, postural dysfunction, and pain.   ACTIVITY LIMITATIONS: lifting, squatting, and continence  PARTICIPATION LIMITATIONS: cleaning, interpersonal relationship, driving, community activity, occupation, and exercise  PERSONAL FACTORS: 1-2 comorbidities: medical history are also affecting patient's functional outcome.   REHAB POTENTIAL: Good  CLINICAL DECISION MAKING: Evolving/moderate complexity  EVALUATION COMPLEXITY:  Moderate   GOALS: Goals reviewed with patient? Yes  SHORT TERM GOALS: Target date: 02/08/2024   Pt will be independent with HEP in order to improve activity tolerance.   Baseline: Goal status: MET 02/22/24  2.  Patient will report 25% improve in pelvic pain in order to increase activity tolerance.    Baseline: 9/10 at worst Goal status: IN PROGRESS 02/22/24  3.  Pt will be independent with use of squatty potty, relaxed toileting mechanics, and improved bowel movement techniques in order to increase ease of bowel movements and complete evacuation.    Baseline: not using Goal status:  IN PROGRESS 02/22/24  4.  Pt will be independent with the knack, urge suppression technique, and double voiding in order to improve bladder habits and decrease urinary incontinence.   Baseline: not using Goal status:  IN PROGRESS 02/22/24  5.  Pt will be able to fully relax and contract pelvic floor in order to improve pelvic pain and urinary incontinence to work with less difficulty Baseline: high tone and poor coordination with contract/relax Goal status:  IN PROGRESS 02/22/24  6.  Pt will be able to correctly perform diaphragmatic breathing and appropriate pressure management in order to prevent worsening vaginal wall/uterine ligament laxity and improve pelvic floor A/ROM.   Baseline: uterine ligament and anterior vaginal wall laxity Goal status:  IN PROGRESS 02/22/24  LONG TERM GOALS: Target date: 06/27/2024  Pt will be independent with advanced HEP in order to improve activity tolerance.   Baseline:  Goal status:  IN PROGRESS  02/22/24  2.  Patient will report 75% improve in pelvic pain in order to increase activity tolerance.   Baseline: 9/10 at worst Goal status:  IN PROGRESS 02/22/24  3.  Pt will be able to go 2-3 hours in between voids without urgency or incontinence in order to improve QOL and perform all functional activities with less difficulty.   Baseline: strong urgency and will  leak on the way to the bathroom Goal status:  IN PROGRESS 02/22/24  4.  Pt will be able to laugh, cough, sneeze, jump, lift, and run without urinary incontinence.  Baseline: leaking with all Goal status:  IN PROGRESS 02/22/24  5.  Patient will be able to perform single leg stance with good pelvic stability in order to demonstrate appropriate pelvic floor muscle and transversus abdominus strength and coordination in order to decrease urinary incontinence and improve pain.   Baseline:  Goal status:  IN PROGRESS 02/22/24  6.  Pt will report 0/10 pain with vaginal penetration in order to improve intimate relationship with partner.    Baseline: pain that stops intercourse Goal status:  IN PROGRESS 02/22/24  PLAN:  PT FREQUENCY: 1-2x/week  PT DURATION: 16 visits    PLANNED INTERVENTIONS: 97164- PT Re-evaluation, 97110-Therapeutic exercises, 97530- Therapeutic activity, 97112- Neuromuscular re-education, 97535- Self Care, 02859- Manual therapy, [phone]- Gait training, [phone]- Aquatic Therapy, [phone]- Electrical stimulation (unattended), [phone]- Traction (mechanical), [id number]- Ionotophoresis 4mg /ml Dexamethasone, 79439 (1-2 muscles), 20561 (3+ muscles)- Dry Needling, Patient/Family education, Balance training, Taping, Joint mobilization, Joint manipulation, Spinal manipulation, Spinal mobilization, Scar mobilization, Vestibular training, Cryotherapy, Moist heat, and Biofeedback  PLAN FOR NEXT SESSION: begin pelvic floor muscle release to superficial/deep musculature vaginally; manual techniques to abdomen and low back; progress mobility activities and down training; begin pelvic stability exercises; bladder habits; bowel habits   Josette Mares, PT, DPT10/15/252:05 PM

## 2024-03-22 ENCOUNTER — Ambulatory Visit

## 2024-03-22 DIAGNOSIS — R102 Pelvic and perineal pain unspecified side: Secondary | ICD-10-CM | POA: Diagnosis not present

## 2024-03-22 DIAGNOSIS — M62838 Other muscle spasm: Secondary | ICD-10-CM

## 2024-03-22 DIAGNOSIS — R279 Unspecified lack of coordination: Secondary | ICD-10-CM

## 2024-03-22 DIAGNOSIS — M6281 Muscle weakness (generalized): Secondary | ICD-10-CM

## 2024-03-22 DIAGNOSIS — R1030 Lower abdominal pain, unspecified: Secondary | ICD-10-CM

## 2024-03-22 DIAGNOSIS — M5459 Other low back pain: Secondary | ICD-10-CM

## 2024-03-22 DIAGNOSIS — R293 Abnormal posture: Secondary | ICD-10-CM

## 2024-03-22 NOTE — Therapy (Signed)
 OUTPATIENT PHYSICAL THERAPY FEMALE PELVIC TREATMENT   Patient Name: Mertice Uffelman MRN: [id number] DOB:04/05/1986, 38 y.o., female Today's Date: 03/22/2024  END OF SESSION:  PT End of Session - 03/22/24 1447     Visit Number 6    Date for Recertification  06/27/24    Authorization Type BCBS    PT Start Time 1447    PT Stop Time 1512    PT Time Calculation (min) 25 min    Activity Tolerance Patient tolerated treatment well    Behavior During Therapy [facility] for tasks assessed/performed           Past Medical History:  Diagnosis Date   Anxiety    Depression    Pregnancy induced hypertension    Thyroid cyst    Past Surgical History:  Procedure Laterality Date   HERNIA REPAIR     Patient Active Problem List   Diagnosis Date Noted   SVD (spontaneous vaginal delivery) 3/15 08/14/2017   Postpartum care following vaginal delivery 08/14/2017   Encounter for planned induction of labor 08/13/2017   Gestational HTN 08/13/2017    PCP: NA  REFERRING PROVIDER: Barbette Knock, MD  REFERRING DIAG: M62.89 (ICD-10-CM) - Other specified disorders of muscle   THERAPY DIAG:  Pelvic pain  Other low back pain  Lower abdominal pain  Other muscle spasm  Muscle weakness (generalized)  Unspecified lack of coordination  Abnormal posture  Rationale for Evaluation and Treatment: Rehabilitation  ONSET DATE: 09/30/23  SUBJECTIVE:                                                                                                                                                                                           SUBJECTIVE STATEMENT: Pt states that all of her back pain is gone, but she is having more bil lower abdominal aching that started Sunday.    PAIN: 03/15/24 Are you having pain? Yes NPRS scale: 1-2/10 Pain location: pelvic pain  Pain type: aching Pain description: pelvis, abdomen, low back   Aggravating factors: lifting, menstrual cycles, sitting in one position   Relieving factors: adjusting position, heating pad  PRECAUTIONS: None  RED FLAGS: None   WEIGHT BEARING RESTRICTIONS: No  FALLS:  Has patient fallen in last 6 months? 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   ACTIVITY LEVEL : very active at work; run/walks with group 1x/work  PLOF: Independent  PATIENT GOALS: to decrease pain  PERTINENT HISTORY:  Hernia repair 2002; 2 vaginal deliveries  Sexual abuse: No  BOWEL MOVEMENT: Pain with bowel movement: No Type of bowel movement:Type (Bristol Stool Scale) 3-4, Frequency 2x/week, and Strain not usually  Fully empty rectum: Yes: sometimes  Leakage: No Pads: No Fiber supplement/laxative No  URINATION: Pain with urination: No Fully empty bladder: Yes:   Stream: hard to start Urgency: Yes  Frequency: 3-4x/day; not usually waking at night  Fluid Intake: not enough - only 16 oz at 4 today Leakage: Urge to void, Walking to the bathroom, Coughing, Sneezing, Laughing, Exercise, Lifting, and running Pads: No  INTERCOURSE:  Ability to have vaginal penetration Yes  Pain with intercourse: During Penetration, Deep Penetration, and refers up into Rt abdomen; aches after intercourse  DrynessNo Climax: pain with orgasms  Marinoff Scale: 0/3 Lubricant: not usually  PREGNANCY: Vaginal deliveries 2 Tearing No Episiotomy No C-section deliveries 0 Currently pregnant No  PROLAPSE: None   OBJECTIVE:  Note: Objective measures were completed at Evaluation unless otherwise noted.  01/11/24: PATIENT SURVEYS:   PFIQ-7: 50  COGNITION: Overall cognitive status: Within functional limits for tasks assessed     SENSATION: Light touch: Appears intact   FUNCTIONAL TESTS:  Squat: Rt weight shift and breath holding  Single leg stance:  Rt: stable  Lt: pelvic drop Curl-up test: abdominal distortion   GAIT: Assistive device utilized: None Comments: WNL  POSTURE: rounded shoulders, forward head, anterior pelvic tilt,  and Lt posterior rotation, Rt thoracic curvature, Lt lumbar curvature   LUMBARAROM/PROM:  A/PROM A/PROM  Eval (% available)  Flexion 100  Extension 100, pain in low back, stretching in abdomen  Right lateral flexion 100  Left lateral flexion 100  Right rotation 100  Left rotation 100   (Blank rows = not tested)  PALPATION:   General: tenderness and restriction in low back  Pelvic Alignment: Lt pelvic rotation   Abdominal: tenderness throughout lower abdomen; apical breathing pattern                 External Perineal Exam: some dryness and labial fusion                             Internal Pelvic Floor: tenderness throughout bil superficial and deep pelvic floor muscles   Patient confirms identification and approves PT to assess internal pelvic floor and treatment Yes  PELVIC MMT:   MMT eval  Vaginal 2/5, 5 second hold, 5 repeat contractions with poor coordination  Diastasis Recti 2 finger widths  (Blank rows = not tested)        TONE: High bil superficial nd deep pelvic floor muscles   PROLAPSE: Grade 1 anterior vaginal wall laxity; grade 1 uterine ligament laxity  TODAY'S TREATMENT:                                                                                                                              DATE:  03/22/24 Manual: Pt provides verbal consent for internal vaginal/rectal pelvic floor exam. Internal vaginal pelvic floor muscle release to tolerance bil levator ani, Rt>Lt Pelvic floor muscle wand education for manual  release of pelvic floor on her own when pain increases Neuromuscular re-education: Diaphragmatic breathing to help improve pelvic floor muscle lengthening Reverse kegel 10x   03/15/24 Neuromuscular re-education: Modified dead bug 10x bil Bridge with hip adduction, transversus abdominus, and pelvic floor muscle 2 x 10 Lower trunk rotation with elevated LE on red ball 2 x 10 Full shoulder flexion + 5 lbs + elevated LE red ball 2 x  10 Supine D2 + 5 lbs + elevated LE red ball 10x bil Cat cow 2 x 10 Bird dog row 10x bil 5 lbs   03/08/24 Neuromuscular re-education: Transversus abdominus training with multimodal cues for improved motor control and breath coordination Supine hip adduction ball press with transversus abdominus and pelvic floor muscle contractions and breath coordination 10x Bridge with hip adduction, transversus abdominus, and pelvic floor muscle 2 x 10 Supine leg extension 10x bil Exercises: Open books 10x bil Wide foot lower trunk rotation 2 x 10    PATIENT EDUCATION:  Education details: See above Person educated: Patient Education method: Programmer, multimedia, Demonstration, Tactile cues, Verbal cues, and Handouts Education comprehension: verbalized understanding  HOME EXERCISE PROGRAM: [id number]  ASSESSMENT:  CLINICAL IMPRESSION: Patient is a 38 y.o. female who was seen today for physical therapy treatment for pelvic/low back/abdominal pain, constipation, and urinary incontinence. Pt having more abdominal pain today. This is likely due to slowly increasing pelvic floor muscle spasm, and stress may have impacted this increase. We returned to pelvic floor muscle release, Rt>Lt, and worked to release increase in restriction. She tolerated very well and did have referral into lower abdomen, indicating that restriction likely was part of increase in symptoms. She will continue to benefit from skilled PT intervention in order to decrease pain, improve constipation, decrease urinary incontinence, and address impairments.    Session cut 15 minutes short by PT due to unforeseen coughing spell.   OBJECTIVE IMPAIRMENTS: decreased activity tolerance, decreased coordination, decreased endurance, decreased mobility, decreased ROM, decreased strength, increased fascial restrictions, increased muscle spasms, impaired flexibility, impaired tone, improper body mechanics, postural dysfunction, and pain.   ACTIVITY  LIMITATIONS: lifting, squatting, and continence  PARTICIPATION LIMITATIONS: cleaning, interpersonal relationship, driving, community activity, occupation, and exercise  PERSONAL FACTORS: 1-2 comorbidities: medical history are also affecting patient's functional outcome.   REHAB POTENTIAL: Good  CLINICAL DECISION MAKING: Evolving/moderate complexity  EVALUATION COMPLEXITY: Moderate   GOALS: Goals reviewed with patient? Yes  SHORT TERM GOALS: Target date: 02/08/2024   Pt will be independent with HEP in order to improve activity tolerance.   Baseline: Goal status: MET 02/22/24  2.  Patient will report 25% improve in pelvic pain in order to increase activity tolerance.    Baseline: 9/10 at worst Goal status: IN PROGRESS 02/22/24  3.  Pt will be independent with use of squatty potty, relaxed toileting mechanics, and improved bowel movement techniques in order to increase ease of bowel movements and complete evacuation.    Baseline: not using Goal status:  IN PROGRESS 02/22/24  4.  Pt will be independent with the knack, urge suppression technique, and double voiding in order to improve bladder habits and decrease urinary incontinence.   Baseline: not using Goal status:  IN PROGRESS 02/22/24  5.  Pt will be able to fully relax and contract pelvic floor in order to improve pelvic pain and urinary incontinence to work with less difficulty Baseline: high tone and poor coordination with contract/relax Goal status:  IN PROGRESS 02/22/24  6.  Pt will be able to correctly perform  diaphragmatic breathing and appropriate pressure management in order to prevent worsening vaginal wall/uterine ligament laxity and improve pelvic floor A/ROM.   Baseline: uterine ligament and anterior vaginal wall laxity Goal status:  IN PROGRESS 02/22/24  LONG TERM GOALS: Target date: 06/27/2024  Pt will be independent with advanced HEP in order to improve activity tolerance.   Baseline:  Goal status:  IN  PROGRESS 02/22/24  2.  Patient will report 75% improve in pelvic pain in order to increase activity tolerance.   Baseline: 9/10 at worst Goal status:  IN PROGRESS 02/22/24  3.  Pt will be able to go 2-3 hours in between voids without urgency or incontinence in order to improve QOL and perform all functional activities with less difficulty.   Baseline: strong urgency and will leak on the way to the bathroom Goal status:  IN PROGRESS 02/22/24  4.  Pt will be able to laugh, cough, sneeze, jump, lift, and run without urinary incontinence.  Baseline: leaking with all Goal status:  IN PROGRESS 02/22/24  5.  Patient will be able to perform single leg stance with good pelvic stability in order to demonstrate appropriate pelvic floor muscle and transversus abdominus strength and coordination in order to decrease urinary incontinence and improve pain.   Baseline:  Goal status:  IN PROGRESS 02/22/24  6.  Pt will report 0/10 pain with vaginal penetration in order to improve intimate relationship with partner.    Baseline: pain that stops intercourse Goal status:  IN PROGRESS 02/22/24  PLAN:  PT FREQUENCY: 1-2x/week  PT DURATION: 16 visits    PLANNED INTERVENTIONS: 97164- PT Re-evaluation, 97110-Therapeutic exercises, 97530- Therapeutic activity, 97112- Neuromuscular re-education, 97535- Self Care, 02859- Manual therapy, [phone]- Gait training, [phone]- Aquatic Therapy, [phone]- Electrical stimulation (unattended), [phone]- Traction (mechanical), [id number]- Ionotophoresis 4mg /ml Dexamethasone, 79439 (1-2 muscles), 20561 (3+ muscles)- Dry Needling, Patient/Family education, Balance training, Taping, Joint mobilization, Joint manipulation, Spinal manipulation, Spinal mobilization, Scar mobilization, Vestibular training, Cryotherapy, Moist heat, and Biofeedback  PLAN FOR NEXT SESSION: begin pelvic floor muscle release to superficial/deep musculature vaginally; manual techniques to abdomen and low back; progress  mobility activities and down training; begin pelvic stability exercises; bladder habits; bowel habits   Josette Mares, PT, DPT10/22/253:19 PM

## 2024-03-29 ENCOUNTER — Encounter

## 2024-04-04 ENCOUNTER — Ambulatory Visit: Attending: Obstetrics & Gynecology

## 2024-04-04 DIAGNOSIS — R102 Pelvic and perineal pain unspecified side: Secondary | ICD-10-CM | POA: Insufficient documentation

## 2024-04-04 DIAGNOSIS — R279 Unspecified lack of coordination: Secondary | ICD-10-CM | POA: Insufficient documentation

## 2024-04-04 DIAGNOSIS — M5459 Other low back pain: Secondary | ICD-10-CM | POA: Insufficient documentation

## 2024-04-04 DIAGNOSIS — R293 Abnormal posture: Secondary | ICD-10-CM | POA: Insufficient documentation

## 2024-04-04 DIAGNOSIS — M6281 Muscle weakness (generalized): Secondary | ICD-10-CM | POA: Insufficient documentation

## 2024-04-04 DIAGNOSIS — R1030 Lower abdominal pain, unspecified: Secondary | ICD-10-CM | POA: Diagnosis present

## 2024-04-04 DIAGNOSIS — M62838 Other muscle spasm: Secondary | ICD-10-CM | POA: Insufficient documentation

## 2024-04-04 NOTE — Therapy (Signed)
 OUTPATIENT PHYSICAL THERAPY FEMALE PELVIC TREATMENT   Patient Name: Adriana Petersen MRN: [id number] DOB:1986/01/31, 38 y.o., female Today's Date: 04/04/2024  END OF SESSION:  PT End of Session - 04/04/24 1403     Visit Number 7    Date for Recertification  06/27/24    Authorization Type BCBS    PT Start Time 1402    PT Stop Time 1443    PT Time Calculation (min) 41 min    Activity Tolerance Patient tolerated treatment well    Behavior During Therapy [facility] for tasks assessed/performed           Past Medical History:  Diagnosis Date   Anxiety    Depression    Pregnancy induced hypertension    Thyroid cyst    Past Surgical History:  Procedure Laterality Date   HERNIA REPAIR     Patient Active Problem List   Diagnosis Date Noted   SVD (spontaneous vaginal delivery) 3/15 08/14/2017   Postpartum care following vaginal delivery 08/14/2017   Encounter for planned induction of labor 08/13/2017   Gestational HTN 08/13/2017    PCP: NA  REFERRING PROVIDER: Barbette Knock, MD  REFERRING DIAG: M62.89 (ICD-10-CM) - Other specified disorders of muscle   THERAPY DIAG:  Pelvic pain  Other low back pain  Lower abdominal pain  Other muscle spasm  Muscle weakness (generalized)  Unspecified lack of coordination  Abnormal posture  Rationale for Evaluation and Treatment: Rehabilitation  ONSET DATE: 09/30/23  SUBJECTIVE:                                                                                                                                                                                           SUBJECTIVE STATEMENT: Pt states that she is feeling good right now. She has been using pelvic floor muscle wand some.    PAIN: 04/04/24 Are you having pain? Yes NPRS scale: 0/10 Pain location: pelvic pain  Pain type: aching Pain description: pelvis, abdomen, low back   Aggravating factors: lifting, menstrual cycles, sitting in one position  Relieving factors:  adjusting position, heating pad  PRECAUTIONS: None  RED FLAGS: None   WEIGHT BEARING RESTRICTIONS: No  FALLS:  Has patient fallen in last 6 months? 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[employer]   ACTIVITY LEVEL : very active at work; run/walks with group 1x/work  PLOF: Independent  PATIENT GOALS: to decrease pain  PERTINENT HISTORY:  Hernia repair 2002; 2 vaginal deliveries  Sexual abuse: No  BOWEL MOVEMENT: Pain with bowel movement: No Type of bowel movement:Type (Bristol Stool Scale) 3-4, Frequency 2x/week, and Strain not usually  Fully empty rectum: Yes:  sometimes  Leakage: No Pads: No Fiber supplement/laxative No  URINATION: Pain with urination: No Fully empty bladder: Yes:   Stream: hard to start Urgency: Yes  Frequency: 3-4x/day; not usually waking at night  Fluid Intake: not enough - only 16 oz at 4 today Leakage: Urge to void, Walking to the bathroom, Coughing, Sneezing, Laughing, Exercise, Lifting, and running Pads: No  INTERCOURSE:  Ability to have vaginal penetration Yes  Pain with intercourse: During Penetration, Deep Penetration, and refers up into Rt abdomen; aches after intercourse  DrynessNo Climax: pain with orgasms  Marinoff Scale: 0/3 Lubricant: not usually  PREGNANCY: Vaginal deliveries 2 Tearing No Episiotomy No C-section deliveries 0 Currently pregnant No  PROLAPSE: None   OBJECTIVE:  Note: Objective measures were completed at Evaluation unless otherwise noted.  01/11/24: PATIENT SURVEYS:   PFIQ-7: 61  COGNITION: Overall cognitive status: Within functional limits for tasks assessed     SENSATION: Light touch: Appears intact   FUNCTIONAL TESTS:  Squat: Rt weight shift and breath holding  Single leg stance:  Rt: stable  Lt: pelvic drop Curl-up test: abdominal distortion   GAIT: Assistive device utilized: None Comments: WNL  POSTURE: rounded shoulders, forward head, anterior pelvic tilt, and Lt posterior  rotation, Rt thoracic curvature, Lt lumbar curvature   LUMBARAROM/PROM:  A/PROM A/PROM  Eval (% available)  Flexion 100  Extension 100, pain in low back, stretching in abdomen  Right lateral flexion 100  Left lateral flexion 100  Right rotation 100  Left rotation 100   (Blank rows = not tested)  PALPATION:   General: tenderness and restriction in low back  Pelvic Alignment: Lt pelvic rotation   Abdominal: tenderness throughout lower abdomen; apical breathing pattern                 External Perineal Exam: some dryness and labial fusion                             Internal Pelvic Floor: tenderness throughout bil superficial and deep pelvic floor muscles   Patient confirms identification and approves PT to assess internal pelvic floor and treatment Yes  PELVIC MMT:   MMT eval  Vaginal 2/5, 5 second hold, 5 repeat contractions with poor coordination  Diastasis Recti 2 finger widths  (Blank rows = not tested)        TONE: High bil superficial nd deep pelvic floor muscles   PROLAPSE: Grade 1 anterior vaginal wall laxity; grade 1 uterine ligament laxity  TODAY'S TREATMENT:                                                                                                                              DATE:  04/04/24 Manual: Prone soft tissue mobilization to bil lumbar paraspinals and glutes  Exercises: Seated forward fold 2 minutes  Seated lateral fold over small swiss ball 10 breaths bil Ball up wall 10x [employer]  roll outs on table 10x  Lateral ball roll outs 5x bil Table ball press 10x Lateral table ball press 10x 03/22/24 Manual: Pt provides verbal consent for internal vaginal/rectal pelvic floor exam. Internal vaginal pelvic floor muscle release to tolerance bil levator ani, Rt>Lt Pelvic floor muscle wand education for manual release of pelvic floor on her own when pain increases Neuromuscular re-education: Diaphragmatic breathing to help improve pelvic floor muscle  lengthening Reverse kegel 10x   03/15/24 Neuromuscular re-education: Modified dead bug 10x bil Bridge with hip adduction, transversus abdominus, and pelvic floor muscle 2 x 10 Lower trunk rotation with elevated LE on red ball 2 x 10 Full shoulder flexion + 5 lbs + elevated LE red ball 2 x 10 Supine D2 + 5 lbs + elevated LE red ball 10x bil Cat cow 2 x 10 Bird dog row 10x bil 5 lbs    PATIENT EDUCATION:  Education details: See above Person educated: Patient Education method: Programmer, Multimedia, Demonstration, Tactile cues, Verbal cues, and Handouts Education comprehension: verbalized understanding  HOME EXERCISE PROGRAM: [id number]  ASSESSMENT:  CLINICAL IMPRESSION: Patient is a 38 y.o. female who was seen today for physical therapy treatment for pelvic/low back/abdominal pain, constipation, and urinary incontinence. Pt doing very well overall with minimal pain this week. She did have some low back restriction and manual techniques performed to help reduce. The rest of the session focused on mobility and stretching to continue helping release of low back. Good tolerance to all activities. She will continue to benefit from skilled PT intervention in order to decrease pain, improve constipation, decrease urinary incontinence, and address impairments.      OBJECTIVE IMPAIRMENTS: decreased activity tolerance, decreased coordination, decreased endurance, decreased mobility, decreased ROM, decreased strength, increased fascial restrictions, increased muscle spasms, impaired flexibility, impaired tone, improper body mechanics, postural dysfunction, and pain.   ACTIVITY LIMITATIONS: lifting, squatting, and continence  PARTICIPATION LIMITATIONS: cleaning, interpersonal relationship, driving, community activity, occupation, and exercise  PERSONAL FACTORS: 1-2 comorbidities: medical history are also affecting patient's functional outcome.   REHAB POTENTIAL: Good  CLINICAL DECISION MAKING:  Evolving/moderate complexity  EVALUATION COMPLEXITY: Moderate   GOALS: Goals reviewed with patient? Yes  SHORT TERM GOALS: Target date: 02/08/2024   Pt will be independent with HEP in order to improve activity tolerance.   Baseline: Goal status: MET 02/22/24  2.  Patient will report 25% improve in pelvic pain in order to increase activity tolerance.    Baseline: 9/10 at worst Goal status:MET 04/04/24  3.  Pt will be independent with use of squatty potty, relaxed toileting mechanics, and improved bowel movement techniques in order to increase ease of bowel movements and complete evacuation.    Baseline: not using Goal status: MET 04/04/24  4.  Pt will be independent with the knack, urge suppression technique, and double voiding in order to improve bladder habits and decrease urinary incontinence.   Baseline: not using Goal status:  MET 04/04/24  5.  Pt will be able to fully relax and contract pelvic floor in order to improve pelvic pain and urinary incontinence to work with less difficulty Baseline: high tone and poor coordination with contract/relax Goal status:  MET 04/04/24  6.  Pt will be able to correctly perform diaphragmatic breathing and appropriate pressure management in order to prevent worsening vaginal wall/uterine ligament laxity and improve pelvic floor A/ROM.   Baseline: uterine ligament and anterior vaginal wall laxity Goal status: MET 04/04/24  LONG TERM GOALS: Target date: 06/27/2024  Pt will be independent  with advanced HEP in order to improve activity tolerance.   Baseline:  Goal status:  IN PROGRESS 04/04/24  2.  Patient will report 75% improve in pelvic pain in order to increase activity tolerance.   Baseline: 9/10 at worst Goal status:  IN PROGRESS 04/04/24  3.  Pt will be able to go 2-3 hours in between voids without urgency or incontinence in order to improve QOL and perform all functional activities with less difficulty.   Baseline: strong urgency  and will leak on the way to the bathroom Goal status:  IN PROGRESS 04/04/24  4.  Pt will be able to laugh, cough, sneeze, jump, lift, and run without urinary incontinence.  Baseline: leaking with all Goal status:  IN PROGRESS 04/04/24  5.  Patient will be able to perform single leg stance with good pelvic stability in order to demonstrate appropriate pelvic floor muscle and transversus abdominus strength and coordination in order to decrease urinary incontinence and improve pain.   Baseline:  Goal status:  IN PROGRESS 04/04/24  6.  Pt will report 0/10 pain with vaginal penetration in order to improve intimate relationship with partner.    Baseline: pain that stops intercourse Goal status:  IN PROGRESS 04/04/24  PLAN:  PT FREQUENCY: 1-2x/week  PT DURATION: 16 visits    PLANNED INTERVENTIONS: 97164- PT Re-evaluation, 97110-Therapeutic exercises, 97530- Therapeutic activity, 97112- Neuromuscular re-education, 97535- Self Care, 02859- Manual therapy, [phone]- Gait training, [phone]- Aquatic Therapy, [phone]- Electrical stimulation (unattended), [phone]- Traction (mechanical), [id number]- Ionotophoresis 4mg /ml Dexamethasone, 79439 (1-2 muscles), 20561 (3+ muscles)- Dry Needling, Patient/Family education, Balance training, Taping, Joint mobilization, Joint manipulation, Spinal manipulation, Spinal mobilization, Scar mobilization, Vestibular training, Cryotherapy, Moist heat, and Biofeedback  PLAN FOR NEXT SESSION: begin pelvic floor muscle release to superficial/deep musculature vaginally; manual techniques to abdomen and low back; progress mobility activities and down training; begin pelvic stability exercises; bladder habits; bowel habits   Josette Mares, PT, DPT11/04/252:37 PM

## 2024-05-17 ENCOUNTER — Ambulatory Visit: Attending: Obstetrics & Gynecology

## 2024-05-17 DIAGNOSIS — R102 Pelvic and perineal pain unspecified side: Secondary | ICD-10-CM | POA: Diagnosis present

## 2024-05-17 DIAGNOSIS — R293 Abnormal posture: Secondary | ICD-10-CM | POA: Insufficient documentation

## 2024-05-17 DIAGNOSIS — R1030 Lower abdominal pain, unspecified: Secondary | ICD-10-CM | POA: Insufficient documentation

## 2024-05-17 DIAGNOSIS — R279 Unspecified lack of coordination: Secondary | ICD-10-CM | POA: Diagnosis present

## 2024-05-17 DIAGNOSIS — M6281 Muscle weakness (generalized): Secondary | ICD-10-CM | POA: Insufficient documentation

## 2024-05-17 DIAGNOSIS — M5459 Other low back pain: Secondary | ICD-10-CM | POA: Insufficient documentation

## 2024-05-17 DIAGNOSIS — M62838 Other muscle spasm: Secondary | ICD-10-CM | POA: Diagnosis present

## 2024-05-17 NOTE — Therapy (Signed)
 OUTPATIENT PHYSICAL THERAPY FEMALE PELVIC TREATMENT   Patient Name: Adriana Petersen MRN: [id number] DOB:Sep 25, 1985, 38 y.o., female Today's Date: 05/17/2024  END OF SESSION:  PT End of Session - 05/17/24 1453     Visit Number 8    Date for Recertification  06/27/24    Authorization Type BC[employer]    PT Start Time 1451    PT Stop Time 1529    PT Time Calculation (min) 38 min    Activity Tolerance Patient tolerated treatment well    Behavior During Therapy WFL for tasks assessed/performed           Past Medical History:  Diagnosis Date   Anxiety    Depression    Pregnancy induced hypertension    Thyroid cyst    Past Surgical History:  Procedure Laterality Date   HERNIA REPAIR     Patient Active Problem List   Diagnosis Date Noted   SVD (spontaneous vaginal delivery) 3/15 08/14/2017   Postpartum care following vaginal delivery 08/14/2017   Encounter for planned induction of labor 08/13/2017   Gestational HTN 08/13/2017    PCP: NA  REFERRING PROVIDER: Barbette Knock, MD  REFERRING DIAG: M62.89 (ICD-10-CM) - Other specified disorders of muscle   THERAPY DIAG:  Pelvic pain  Other low back pain  Lower abdominal pain  Other muscle spasm  Muscle weakness (generalized)  Unspecified lack of coordination  Abnormal posture  Rationale for Evaluation and Treatment: Rehabilitation  ONSET DATE: 09/30/23  SUBJECTIVE:                                                                                                                                                                                           SUBJECTIVE STATEMENT: Pt states that she feels like she has been trying to stay on top of exercises. She does perform exercises and stretches and feels like they are helpful. She has only had to use heating pad on low back 1x. She is not as good about performing internal release techniques. She has been performing more physical activity at work which is causing achiness in  low back.    PAIN: 05/17/24 Are you having pain? Yes NPRS scale: 2/10 Pain location: pelvic pain  Pain type: aching Pain description: pelvis, abdomen, low back   Aggravating factors: lifting, menstrual cycles, sitting in one position  Relieving factors: adjusting position, heating pad  PRECAUTIONS: None  RED FLAGS: None   WEIGHT BEARING RESTRICTIONS: No  FALLS:  Has patient fallen in last 6 months? 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[employer]   ACTIVITY LEVEL : very active at work; run/walks with group 1x/work  PLOF:  Independent  PATIENT GOALS: to decrease pain  PERTINENT HISTORY:  Hernia repair 2002; 2 vaginal deliveries  Sexual abuse: No  BOWEL MOVEMENT: Pain with bowel movement: No Type of bowel movement:Type (Bristol Stool Scale) 3-4, Frequency 2x/week, and Strain not usually  Fully empty rectum: Yes: sometimes  Leakage: No Pads: No Fiber supplement/laxative No  URINATION: Pain with urination: No Fully empty bladder: Yes:   Stream: hard to start Urgency: Yes  Frequency: 3-4x/day; not usually waking at night  Fluid Intake: not enough - only 16 oz at 4 today Leakage: Urge to void, Walking to the bathroom, Coughing, Sneezing, Laughing, Exercise, Lifting, and running Pads: No  INTERCOURSE:  Ability to have vaginal penetration Yes  Pain with intercourse: During Penetration, Deep Penetration, and refers up into Rt abdomen; aches after intercourse  DrynessNo Climax: pain with orgasms  Marinoff Scale: 0/3 Lubricant: not usually  PREGNANCY: Vaginal deliveries 2 Tearing No Episiotomy No C-section deliveries 0 Currently pregnant No  PROLAPSE: None   OBJECTIVE:  Note: Objective measures were completed at Evaluation unless otherwise noted.  01/11/24: PATIENT SURVEYS:   PFIQ-7: 85  COGNITION: Overall cognitive status: Within functional limits for tasks assessed     SENSATION: Light touch: Appears intact   FUNCTIONAL TESTS:  Squat: Rt  weight shift and breath holding  Single leg stance:  Rt: stable  Lt: pelvic drop Curl-up test: abdominal distortion   GAIT: Assistive device utilized: None Comments: WNL  POSTURE: rounded shoulders, forward head, anterior pelvic tilt, and Lt posterior rotation, Rt thoracic curvature, Lt lumbar curvature   LUMBARAROM/PROM:  A/PROM A/PROM  Eval (% available)  Flexion 100  Extension 100, pain in low back, stretching in abdomen  Right lateral flexion 100  Left lateral flexion 100  Right rotation 100  Left rotation 100   (Blank rows = not tested)  PALPATION:   General: tenderness and restriction in low back  Pelvic Alignment: Lt pelvic rotation   Abdominal: tenderness throughout lower abdomen; apical breathing pattern                 External Perineal Exam: some dryness and labial fusion                             Internal Pelvic Floor: tenderness throughout bil superficial and deep pelvic floor muscles   Patient confirms identification and approves PT to assess internal pelvic floor and treatment Yes  PELVIC MMT:   MMT eval  Vaginal 2/5, 5 second hold, 5 repeat contractions with poor coordination  Diastasis Recti 2 finger widths  (Blank rows = not tested)        TONE: High bil superficial nd deep pelvic floor muscles   PROLAPSE: Grade 1 anterior vaginal wall laxity; grade 1 uterine ligament laxity  TODAY'S TREATMENT:  DATE:  05/17/24 Manual: Pt provides verbal consent for internal vaginal/rectal pelvic floor exam. Internal vaginal pelvic floor muscle release to tolerance bil levator ani Pelvic floor muscle wand education for manual release of pelvic floor on her own when pain increases Supine soft tissue mobilization to lower abdomen Neuromuscular re-education: Diaphragmatic breathing to help improve pelvic floor muscle  lengthening Reverse kegel 10x   04/04/24 Manual: Prone soft tissue mobilization to bil lumbar paraspinals and glutes  Exercises: Seated forward fold 2 minutes  Seated lateral fold over small swiss ball 10 breaths bil Ball up wall 10x Ball roll outs on table 10x  Lateral ball roll outs 5x bil Table ball press 10x Lateral table ball press 10x 03/22/24 Manual: Pt provides verbal consent for internal vaginal/rectal pelvic floor exam. Internal vaginal pelvic floor muscle release to tolerance bil levator ani, Rt>Lt Pelvic floor muscle wand education for manual release of pelvic floor on her own when pain increases Neuromuscular re-education: Diaphragmatic breathing to help improve pelvic floor muscle lengthening Reverse kegel 10x    PATIENT EDUCATION:  Education details: See above Person educated: Patient Education method: Programmer, Multimedia, Demonstration, Tactile cues, Verbal cues, and Handouts Education comprehension: verbalized understanding  HOME EXERCISE PROGRAM: [id number]  ASSESSMENT:  CLINICAL IMPRESSION: Patient is a 38 y.o. female who was seen today for physical therapy treatment for pelvic/low back/abdominal pain, constipation, and urinary incontinence. Pt overall doing well, as she has been keeping up with stretches and some strengthening exercises. However, she is noticing that stress is causing increased low back pain. Due to her history, believe that this may be due to increase in pelvic floor muscle tension since she has not been able to use wand frequently. There was significant restriction in bil levator ani and lower abdomen. She responded very well to manual techniques, but reported some lower abdominal soreness at end of session. She will continue to benefit from skilled PT intervention in order to decrease pain, improve constipation, decrease urinary incontinence, and address impairments.      OBJECTIVE IMPAIRMENTS: decreased activity tolerance, decreased  coordination, decreased endurance, decreased mobility, decreased ROM, decreased strength, increased fascial restrictions, increased muscle spasms, impaired flexibility, impaired tone, improper body mechanics, postural dysfunction, and pain.   ACTIVITY LIMITATIONS: lifting, squatting, and continence  PARTICIPATION LIMITATIONS: cleaning, interpersonal relationship, driving, community activity, occupation, and exercise  PERSONAL FACTORS: 1-2 comorbidities: medical history are also affecting patient's functional outcome.   REHAB POTENTIAL: Good  CLINICAL DECISION MAKING: Evolving/moderate complexity  EVALUATION COMPLEXITY: Moderate   GOALS: Goals reviewed with patient? Yes  SHORT TERM GOALS: Target date: 02/08/2024   Pt will be independent with HEP in order to improve activity tolerance.   Baseline: Goal status: MET 02/22/24  2.  Patient will report 25% improve in pelvic pain in order to increase activity tolerance.    Baseline: 9/10 at worst Goal status:MET 04/04/24  3.  Pt will be independent with use of squatty potty, relaxed toileting mechanics, and improved bowel movement techniques in order to increase ease of bowel movements and complete evacuation.    Baseline: not using Goal status: MET 04/04/24  4.  Pt will be independent with the knack, urge suppression technique, and double voiding in order to improve bladder habits and decrease urinary incontinence.   Baseline: not using Goal status:  MET 04/04/24  5.  Pt will be able to fully relax and contract pelvic floor in order to improve pelvic pain and urinary incontinence to work with less difficulty Baseline: high tone and poor  coordination with contract/relax Goal status:  MET 04/04/24  6.  Pt will be able to correctly perform diaphragmatic breathing and appropriate pressure management in order to prevent worsening vaginal wall/uterine ligament laxity and improve pelvic floor A/ROM.   Baseline: uterine ligament and  anterior vaginal wall laxity Goal status: MET 04/04/24  LONG TERM GOALS: Target date: 06/27/2024  Pt will be independent with advanced HEP in order to improve activity tolerance.   Baseline:  Goal status:  IN PROGRESS 05/17/24  2.  Patient will report 75% improve in pelvic pain in order to increase activity tolerance.   Baseline: 9/10 at worst Goal status:  IN PROGRESS 05/17/24  3.  Pt will be able to go 2-3 hours in between voids without urgency or incontinence in order to improve QOL and perform all functional activities with less difficulty.   Baseline: strong urgency and will leak on the way to the bathroom Goal status:  IN PROGRESS 05/17/24  4.  Pt will be able to laugh, cough, sneeze, jump, lift, and run without urinary incontinence.  Baseline: leaking with all Goal status:  IN PROGRESS 05/17/24  5.  Patient will be able to perform single leg stance with good pelvic stability in order to demonstrate appropriate pelvic floor muscle and transversus abdominus strength and coordination in order to decrease urinary incontinence and improve pain.   Baseline:  Goal status:  IN PROGRESS 05/17/24  6.  Pt will report 0/10 pain with vaginal penetration in order to improve intimate relationship with partner.    Baseline: pain that stops intercourse Goal status:  IN PROGRESS 05/17/24  PLAN:  PT FREQUENCY: 1-2x/week  PT DURATION: 16 visits    PLANNED INTERVENTIONS: 97164- PT Re-evaluation, 97110-Therapeutic exercises, 97530- Therapeutic activity, 97112- Neuromuscular re-education, 97535- Self Care, 02859- Manual therapy, [phone]- Gait training, [phone]- Aquatic Therapy, [phone]- Electrical stimulation (unattended), [phone]- Traction (mechanical), [id number]- Ionotophoresis 4mg /ml Dexamethasone, 79439 (1-2 muscles), 20561 (3+ muscles)- Dry Needling, Patient/Family education, Balance training, Taping, Joint mobilization, Joint manipulation, Spinal manipulation, Spinal mobilization, Scar mobilization,  Vestibular training, Cryotherapy, Moist heat, and Biofeedback  PLAN FOR NEXT SESSION: begin pelvic floor muscle release to superficial/deep musculature vaginally; manual techniques to abdomen and low back; progress mobility activities and down training; begin pelvic stability exercises; bladder habits; bowel habits   Josette Mares, PT, DPT12/17/20252:57 PM

## 2024-05-19 ENCOUNTER — Encounter: Payer: Self-pay | Admitting: Family

## 2024-05-19 ENCOUNTER — Ambulatory Visit: Admitting: Family

## 2024-05-19 VITALS — BP 138/72 | HR 76 | Temp 97.2°F | Ht 62.0 in | Wt 152.2 lb

## 2024-05-19 DIAGNOSIS — Z91011 Allergy to milk products, unspecified: Secondary | ICD-10-CM | POA: Insufficient documentation

## 2024-05-19 DIAGNOSIS — F419 Anxiety disorder, unspecified: Secondary | ICD-10-CM

## 2024-05-19 DIAGNOSIS — N951 Menopausal and female climacteric states: Secondary | ICD-10-CM | POA: Diagnosis not present

## 2024-05-19 DIAGNOSIS — F32A Depression, unspecified: Secondary | ICD-10-CM

## 2024-05-19 DIAGNOSIS — R5383 Other fatigue: Secondary | ICD-10-CM | POA: Insufficient documentation

## 2024-05-19 DIAGNOSIS — G43909 Migraine, unspecified, not intractable, without status migrainosus: Secondary | ICD-10-CM | POA: Insufficient documentation

## 2024-05-19 DIAGNOSIS — F520 Hypoactive sexual desire disorder: Secondary | ICD-10-CM | POA: Insufficient documentation

## 2024-05-19 DIAGNOSIS — Z23 Encounter for immunization: Secondary | ICD-10-CM

## 2024-05-19 MED ORDER — BUPROPION HCL ER (XL) 300 MG PO TB24: 300.0000 mg | ORAL_TABLET | Freq: Every day | ORAL | 1 refills | Status: AC

## 2024-05-19 MED ORDER — ESCITALOPRAM OXALATE 20 MG PO TABS: 20.0000 mg | ORAL_TABLET | Freq: Every day | ORAL | 1 refills | Status: AC

## 2024-05-19 NOTE — Patient Instructions (Addendum)
 Welcome to [facility], It was a pleasure meeting you today!   Please schedule a physical with fasting labs at your convenience.  Magnesium glycinate, L-threonate, or taurate can help with anxiety, maintaining sleep (if taken at night), muscle recovery, good bowel function, hot flashes, and maintaining blood pressure. Look for chelated form (better absorbability) and for any over the counter supplement or vitamin, look for organic or has a 3rd party seal from NSF international, [employer] or USP. This authenticates the quality but not the efficacy (since not FDA approved).     Estradiol is the oral estrogen replacement (also comes in a weekly or biweekly patch) that can help improve some of your peri-menopausal symptoms. See the attached handout for more information.     PLEASE NOTE: If you had any LAB tests please let us  know if you have not heard back within a few days. You may see your results on MyChart before we have a chance to review them but we will give you a call once they are reviewed by us . If we ordered any REFERRALS today, please let us  know if you have not heard from their office within the next week.  Let us  know through MyChart if you are needing REFILLS, or have your pharmacy send us  the request. You can also use MyChart to communicate with me or any office staff.  Please try these tips to maintain a healthy lifestyle: It is important that you exercise regularly at least 30 minutes 5 times a week. Think about what you will eat, plan ahead. Choose whole foods, & think  clean, green, fresh or frozen over canned, processed or packaged foods which are more sugary, salty, and fatty. 70 to 75% of food eaten should be fresh vegetables and protein. 2-3  meals daily with healthy snacks between meals, but must be whole fruit, protein or vegetables. Aim to eat over a 10 hour period when you are active, for example, 7am to 5pm, and then STOP after your  last meal of the day, drinking only water.  Shorter eating windows, 6-8 hours, are showing benefits in heart disease and blood sugar regulation. Drink water every day! Shoot for 64 ounces daily = 8 cups, no other drink is as healthy! Fruit juice is best enjoyed in a healthy way, by EATING the fruit.

## 2024-05-19 NOTE — Progress Notes (Unsigned)
 "  New Patient Office Visit  Subjective:  Patient ID: Adriana Petersen, female    DOB: [birth year]  Age: 38 y.o. MRN: [id number]  CC:  Chief Complaint  Patient presents with   New Patient (Initial Visit)   Anxiety/Depression    Pt would like a refill of Zoloft  and Wellbutrin .    Menopause    Pt c/o hot/cold flashes, brain fog and mood swings.    Bloated    Pt c/o bloating and discomfort after eating dairy.    HPI Adriana Petersen presents for establishing care today. Discussed the use of AI scribe software for clinical note transcription with the patient, who gave verbal consent to proceed.  History of Present Illness Adriana Petersen is a 38 year old female who presents with peri-menopausal symptoms, concerns regarding a dairy allergy and needing refills on her medications.  She reports emotional instability, hot flashes, sleep disturbance, and brain fog consistent with peri-menopausal symptoms. She attends pelvic floor therapy with some relief for her bladder issues. Menses are irregular and heavy with clots, improved since IUD placement in June or July 2025. She has bloating and gas, which she associates with dairy, especially whole milk, and now uses Fairlife milk to reduce symptoms. She has anxiety and depression treated with escitalopram  20 mg daily and bupropion  300 mg daily and attends therapy every other week.  Assessment & Plan Perimenopausal symptoms Symptoms include hot flashes, mood changes, brain fog, and irregular cycles. IUD reduced bleeding but not cycle regularity. Discussed low-dose estrogen therapy benefits and risks. Considered vaginal estrogen and black cohosh for symptom management. - Consider low-dose estrogen therapy. - Consider vaginal estrogen for local symptoms. - Consider black cohosh for hot flashes and night sweats. - Recommend magnesium for sleep and bowel maintenance. - Recommend vitamin D 2000 units daily. - Recommend omega-3 fish oil for  cholesterol.  Anxiety and depression Managed with escitalopram  and bupropion . Therapy sessions biweekly. No recent medication changes. - Continue escitalopram  20 mg daily. - Continue bupropion  300 mg daily. - Continue therapy sessions biweekly.  Possible lactose intolerance Symptoms suggest lactose intolerance. Discussed food allergy testing. - Consider food allergy testing. - Advise trial of lactose-free dairy products.  General Health Maintenance Discussed health maintenance. Blood pressure slightly elevated, possibly due to anxiety. No recent labs. - Schedule follow-up for blood pressure and labs. - Administered flu shot.   Subjective:    Outpatient Medications Prior to Visit  Medication Sig Dispense Refill   acetaminophen  (TYLENOL ) 500 MG tablet Take 500 mg by mouth every 6 (six) hours as needed for moderate pain.     ibuprofen  (ADVIL ,MOTRIN ) 600 MG tablet Take 1 tablet (600 mg total) by mouth every 6 (six) hours. 30 tablet 0   levonorgestrel (LILETTA, 52 MG,) 20.1 MCG/DAY IUD IUD Take 1 device by intrauterine route.     Multiple Vitamin (MULTIVITAMIN) tablet Take 1 tablet by mouth daily.     escitalopram  (LEXAPRO ) 20 MG tablet Take 20 mg by mouth daily.     buPROPion  (WELLBUTRIN  XL) 150 MG 24 hr tablet Take 150 mg by mouth daily.     sertraline  (ZOLOFT ) 50 MG tablet Take 50 mg by mouth daily.  3   No facility-administered medications prior to visit.   Past Medical History:  Diagnosis Date   Anxiety    Depression    Encounter for planned induction of labor 08/13/2017   Encounter for routine checking of intrauterine contraceptive device 01/07/2024   Fatigue 05/19/2024   Generalized anxiety disorder with  panic attacks 02/24/2023   Gestational HTN 08/13/2017   Headache 05/03/2020   Lack or loss of sexual desire 05/19/2024   MDD (major depressive disorder) 11/02/2019   Migraine 05/19/2024   Migraine without aura and without status migrainosus, not  intractable 07/30/2021   Pain 12/16/2016   Postpartum care following vaginal delivery 08/14/2017   Pregnancy induced hypertension    Premenstrual headache 07/30/2021   SVD (spontaneous vaginal delivery) 3/15 08/14/2017   Thyroid cyst    Past Surgical History:  Procedure Laterality Date   HERNIA REPAIR      Objective:   Today's Vitals: BP 138/72 (BP Location: Left Arm, Patient Position: Sitting, Cuff Size: Normal)   Pulse 76   Temp (!) 97.2 F (36.2 C) (Temporal)   Ht 5' 2 (1.575 m)   Wt 152 lb 3.2 oz (69 kg)   LMP 05/04/2024 (Exact Date)   SpO2 98%   Breastfeeding No   BMI 27.84 kg/m   Physical Exam Vitals and nursing note reviewed.  Constitutional:      Appearance: Normal appearance.  Cardiovascular:     Rate and Rhythm: Normal rate and regular rhythm.  Pulmonary:     Effort: Pulmonary effort is normal.     Breath sounds: Normal breath sounds.  Musculoskeletal:        General: Normal range of motion.  Skin:    General: Skin is warm and dry.  Neurological:     Mental Status: She is alert.  Psychiatric:        Mood and Affect: Mood normal.        Behavior: Behavior normal.     Meds ordered this encounter  Medications   escitalopram  (LEXAPRO ) 20 MG tablet    Sig: Take 1 tablet (20 mg total) by mouth daily.    Dispense:  90 tablet    Refill:  1   buPROPion  (WELLBUTRIN  XL) 300 MG 24 hr tablet    Sig: Take 1 tablet (300 mg total) by mouth daily.    Dispense:  90 tablet    Refill:  1    Adriana Krabbe, NP "

## 2024-05-29 ENCOUNTER — Ambulatory Visit

## 2024-06-05 ENCOUNTER — Ambulatory Visit: Attending: Obstetrics & Gynecology

## 2024-06-05 DIAGNOSIS — M62838 Other muscle spasm: Secondary | ICD-10-CM | POA: Insufficient documentation

## 2024-06-05 DIAGNOSIS — R293 Abnormal posture: Secondary | ICD-10-CM | POA: Diagnosis present

## 2024-06-05 DIAGNOSIS — M5459 Other low back pain: Secondary | ICD-10-CM | POA: Insufficient documentation

## 2024-06-05 DIAGNOSIS — R279 Unspecified lack of coordination: Secondary | ICD-10-CM | POA: Insufficient documentation

## 2024-06-05 DIAGNOSIS — R1030 Lower abdominal pain, unspecified: Secondary | ICD-10-CM | POA: Diagnosis present

## 2024-06-05 DIAGNOSIS — R102 Pelvic and perineal pain unspecified side: Secondary | ICD-10-CM | POA: Insufficient documentation

## 2024-06-05 DIAGNOSIS — M6281 Muscle weakness (generalized): Secondary | ICD-10-CM | POA: Insufficient documentation

## 2024-06-05 NOTE — Therapy (Signed)
 " OUTPATIENT PHYSICAL THERAPY FEMALE PELVIC TREATMENT   Patient Name: Adriana Petersen MRN: [id number] DOB:05/16/1986, 39 y.o., female Today's Date: 06/05/2024  END OF SESSION:  PT End of Session - 06/05/24 1232     Visit Number 9    Date for Recertification  06/27/24    Authorization Type BCBS    PT Start Time 1232    PT Stop Time 1310    PT Time Calculation (min) 38 min    Activity Tolerance Patient tolerated treatment well    Behavior During Therapy [facility] for tasks assessed/performed           Past Medical History:  Diagnosis Date   Anxiety    Depression    Encounter for planned induction of labor 08/13/2017   Encounter for routine checking of intrauterine contraceptive device 01/07/2024   Fatigue 05/19/2024   Generalized anxiety disorder with panic attacks 02/24/2023   Gestational HTN 08/13/2017   Headache 05/03/2020   Lack or loss of sexual desire 05/19/2024   MDD (major depressive disorder) 11/02/2019   Migraine 05/19/2024   Migraine without aura and without status migrainosus, not intractable 07/30/2021   Pain 12/16/2016   Postpartum care following vaginal delivery 08/14/2017   Pregnancy induced hypertension    Premenstrual headache 07/30/2021   SVD (spontaneous vaginal delivery) 3/15 08/14/2017   Thyroid cyst    Past Surgical History:  Procedure Laterality Date   HERNIA REPAIR     Patient Active Problem List   Diagnosis Date Noted   Dairy allergy 05/19/2024   Perimenopausal 05/19/2024    PCP: NA  REFERRING PROVIDER: Barbette Knock, MD  REFERRING DIAG: M62.89 (ICD-10-CM) - Other specified disorders of muscle   THERAPY DIAG:  Pelvic pain  Unspecified lack of coordination  Other low back pain  Lower abdominal pain  Other muscle spasm  Muscle weakness (generalized)  Abnormal posture  Rationale for Evaluation and Treatment: Rehabilitation  ONSET DATE: 09/30/23  SUBJECTIVE:                                                                                                                                                                                            SUBJECTIVE STATEMENT: Pt states that she has not been able ot get to any exercises in the last two weeks. She is having difficulty with constipation. She has had less low back and pelvic pain.    PAIN: 06/05/2024 Are you having pain? Yes NPRS scale: 0/10 Pain location: pelvic pain  Pain type: aching Pain description: pelvis, abdomen, low back   Aggravating factors: lifting, menstrual cycles, sitting in one position  Relieving factors: adjusting position, heating pad  PRECAUTIONS: None  RED FLAGS: None   WEIGHT BEARING RESTRICTIONS: No  FALLS:  Has patient fallen in last 6 months? 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[employer]   ACTIVITY LEVEL : very active at work; run/walks with group 1x/work  PLOF: Independent  PATIENT GOALS: to decrease pain  PERTINENT HISTORY:  Hernia repair 2002; 2 vaginal deliveries  Sexual abuse: No  BOWEL MOVEMENT: Pain with bowel movement: No Type of bowel movement:Type (Bristol Stool Scale) 3-4, Frequency 2x/week, and Strain not usually  Fully empty rectum: Yes: sometimes  Leakage: No Pads: No Fiber supplement/laxative No  URINATION: Pain with urination: No Fully empty bladder: Yes:   Stream: hard to start Urgency: Yes  Frequency: 3-4x/day; not usually waking at night  Fluid Intake: not enough - only 16 oz at 4 today Leakage: Urge to void, Walking to the bathroom, Coughing, Sneezing, Laughing, Exercise, Lifting, and running Pads: No  INTERCOURSE:  Ability to have vaginal penetration Yes  Pain with intercourse: During Penetration, Deep Penetration, and refers up into Rt abdomen; aches after intercourse  DrynessNo Climax: pain with orgasms  Marinoff Scale: 0/3 Lubricant: not usually  PREGNANCY: Vaginal deliveries 2 Tearing No Episiotomy No C-section deliveries 0 Currently pregnant  No  PROLAPSE: None   OBJECTIVE:  Note: Objective measures were completed at Evaluation unless otherwise noted.  01/11/24: PATIENT SURVEYS:   PFIQ-7: 102  COGNITION: Overall cognitive status: Within functional limits for tasks assessed     SENSATION: Light touch: Appears intact   FUNCTIONAL TESTS:  Squat: Rt weight shift and breath holding  Single leg stance:  Rt: stable  Lt: pelvic drop Curl-up test: abdominal distortion   GAIT: Assistive device utilized: None Comments: WNL  POSTURE: rounded shoulders, forward head, anterior pelvic tilt, and Lt posterior rotation, Rt thoracic curvature, Lt lumbar curvature   LUMBARAROM/PROM:  A/PROM A/PROM  Eval (% available)  Flexion 100  Extension 100, pain in low back, stretching in abdomen  Right lateral flexion 100  Left lateral flexion 100  Right rotation 100  Left rotation 100   (Blank rows = not tested)  PALPATION:   General: tenderness and restriction in low back  Pelvic Alignment: Lt pelvic rotation   Abdominal: tenderness throughout lower abdomen; apical breathing pattern                 External Perineal Exam: some dryness and labial fusion                             Internal Pelvic Floor: tenderness throughout bil superficial and deep pelvic floor muscles   Patient confirms identification and approves PT to assess internal pelvic floor and treatment Yes  PELVIC MMT:   MMT eval  Vaginal 2/5, 5 second hold, 5 repeat contractions with poor coordination  Diastasis Recti 2 finger widths  (Blank rows = not tested)        TONE: High bil superficial nd deep pelvic floor muscles   PROLAPSE: Grade 1 anterior vaginal wall laxity; grade 1 uterine ligament laxity  TODAY'S TREATMENT:  DATE:  06/05/2024 Manual: Supine: ILU massage Bowel mobilization clockwise  Neuromuscular  re-education: Bridge with hip adduction, transversus abdominus, and pelvic floor muscle 2 x 10 Bridge + march 2 x 10 Supine leg extensions 10x bil Exercises: Lower trunk rotation 2 x 10 Single knee to chest 5x bil   05/17/24 Manual: Pt provides verbal consent for internal vaginal/rectal pelvic floor exam. Internal vaginal pelvic floor muscle release to tolerance bil levator ani Pelvic floor muscle wand education for manual release of pelvic floor on her own when pain increases Supine soft tissue mobilization to lower abdomen Neuromuscular re-education: Diaphragmatic breathing to help improve pelvic floor muscle lengthening Reverse kegel 10x   04/04/24 Manual: Prone soft tissue mobilization to bil lumbar paraspinals and glutes  Exercises: Seated forward fold 2 minutes  Seated lateral fold over small swiss ball 10 breaths bil Ball up wall 10x Ball roll outs on table 10x  Lateral ball roll outs 5x bil Table ball press 10x Lateral table ball press 10x   PATIENT EDUCATION:  Education details: See above Person educated: Patient Education method: Programmer, Multimedia, Facilities Manager, Actor cues, Verbal cues, and Handouts Education comprehension: verbalized understanding  HOME EXERCISE PROGRAM: [id number]  ASSESSMENT:  CLINICAL IMPRESSION: Patient is a 39 y.o. female who was seen today for physical therapy treatment for pelvic/low back/abdominal pain, constipation, and urinary incontinence. Pt doing well with low back nad pelvic pain. She has not been able to be as consistent with exercises over the holidays. She feels like constipation is an issue right now and she feels like this is due to stress. We performed manual techniques to abdomen to help improve motility before pain increases. She was able to work on core exercise progressions with good tolerance. We started discharge planning and she feels like she is progressing towards her goals well and be ready to discharge at the end of  this month. She will continue to benefit from skilled PT intervention in order to decrease pain, improve constipation, decrease urinary incontinence, and address impairments.      OBJECTIVE IMPAIRMENTS: decreased activity tolerance, decreased coordination, decreased endurance, decreased mobility, decreased ROM, decreased strength, increased fascial restrictions, increased muscle spasms, impaired flexibility, impaired tone, improper body mechanics, postural dysfunction, and pain.   ACTIVITY LIMITATIONS: lifting, squatting, and continence  PARTICIPATION LIMITATIONS: cleaning, interpersonal relationship, driving, community activity, occupation, and exercise  PERSONAL FACTORS: 1-2 comorbidities: medical history are also affecting patient's functional outcome.   REHAB POTENTIAL: Good  CLINICAL DECISION MAKING: Evolving/moderate complexity  EVALUATION COMPLEXITY: Moderate   GOALS: Goals reviewed with patient? Yes  SHORT TERM GOALS: Target date: 02/08/2024   Pt will be independent with HEP in order to improve activity tolerance.   Baseline: Goal status: MET 02/22/24  2.  Patient will report 25% improve in pelvic pain in order to increase activity tolerance.    Baseline: 9/10 at worst Goal status:MET 04/04/24  3.  Pt will be independent with use of squatty potty, relaxed toileting mechanics, and improved bowel movement techniques in order to increase ease of bowel movements and complete evacuation.    Baseline: not using Goal status: MET 04/04/24  4.  Pt will be independent with the knack, urge suppression technique, and double voiding in order to improve bladder habits and decrease urinary incontinence.   Baseline: not using Goal status:  MET 04/04/24  5.  Pt will be able to fully relax and contract pelvic floor in order to improve pelvic pain and urinary incontinence to work with less difficulty Baseline: high  tone and poor coordination with contract/relax Goal status:  MET  04/04/24  6.  Pt will be able to correctly perform diaphragmatic breathing and appropriate pressure management in order to prevent worsening vaginal wall/uterine ligament laxity and improve pelvic floor A/ROM.   Baseline: uterine ligament and anterior vaginal wall laxity Goal status: MET 04/04/24  LONG TERM GOALS: Target date: 06/27/2024  Pt will be independent with advanced HEP in order to improve activity tolerance.   Baseline:  Goal status:  IN PROGRESS 05/17/24  2.  Patient will report 75% improve in pelvic pain in order to increase activity tolerance.   Baseline: 9/10 at worst Goal status:  IN PROGRESS 05/17/24  3.  Pt will be able to go 2-3 hours in between voids without urgency or incontinence in order to improve QOL and perform all functional activities with less difficulty.   Baseline: strong urgency and will leak on the way to the bathroom Goal status:  IN PROGRESS 05/17/24  4.  Pt will be able to laugh, cough, sneeze, jump, lift, and run without urinary incontinence.  Baseline: leaking with all Goal status:  IN PROGRESS 05/17/24  5.  Patient will be able to perform single leg stance with good pelvic stability in order to demonstrate appropriate pelvic floor muscle and transversus abdominus strength and coordination in order to decrease urinary incontinence and improve pain.   Baseline:  Goal status:  IN PROGRESS 05/17/24  6.  Pt will report 0/10 pain with vaginal penetration in order to improve intimate relationship with partner.    Baseline: pain that stops intercourse Goal status:  IN PROGRESS 05/17/24  PLAN:  PT FREQUENCY: 1-2x/week  PT DURATION: 16 visits    PLANNED INTERVENTIONS: 97164- PT Re-evaluation, 97110-Therapeutic exercises, 97530- Therapeutic activity, 97112- Neuromuscular re-education, 97535- Self Care, 02859- Manual therapy, [phone]- Gait training, [phone]- Aquatic Therapy, [phone]- Electrical stimulation (unattended), [phone]- Traction (mechanical), [phone]-  Ionotophoresis 4mg /ml Dexamethasone, 79439 (1-2 muscles), 20561 (3+ muscles)- Dry Needling, Patient/Family education, Balance training, Taping, Joint mobilization, Joint manipulation, Spinal manipulation, Spinal mobilization, Scar mobilization, Vestibular training, Cryotherapy, Moist heat, and Biofeedback  PLAN FOR NEXT SESSION: begin pelvic floor muscle release to superficial/deep musculature vaginally; manual techniques to abdomen and low back; progress mobility activities and down training; begin pelvic stability exercises; bladder habits; bowel habits   Josette Mares, PT, DPT1/5/20261:12 PM   "

## 2024-06-13 ENCOUNTER — Ambulatory Visit

## 2024-06-13 DIAGNOSIS — M6281 Muscle weakness (generalized): Secondary | ICD-10-CM

## 2024-06-13 DIAGNOSIS — R102 Pelvic and perineal pain unspecified side: Secondary | ICD-10-CM

## 2024-06-13 DIAGNOSIS — R279 Unspecified lack of coordination: Secondary | ICD-10-CM

## 2024-06-13 DIAGNOSIS — R1030 Lower abdominal pain, unspecified: Secondary | ICD-10-CM

## 2024-06-13 DIAGNOSIS — M62838 Other muscle spasm: Secondary | ICD-10-CM

## 2024-06-13 DIAGNOSIS — R293 Abnormal posture: Secondary | ICD-10-CM

## 2024-06-13 DIAGNOSIS — M5459 Other low back pain: Secondary | ICD-10-CM

## 2024-06-13 NOTE — Therapy (Signed)
 " OUTPATIENT PHYSICAL THERAPY FEMALE PELVIC TREATMENT   Patient Name: Adriana Petersen MRN: [id number] DOB:[birth year], 39 y.o., female Today's Date: 06/13/2024  END OF SESSION:  PT End of Session - 06/13/24 1614     Visit Number 10    Date for Recertification  06/27/24    Authorization Type BCBS    PT Start Time 1615    PT Stop Time 1655    PT Time Calculation (min) 40 min    Activity Tolerance Patient tolerated treatment well    Behavior During Therapy [facility] for tasks assessed/performed           Past Medical History:  Diagnosis Date   Anxiety    Depression    Encounter for planned induction of labor 08/13/2017   Encounter for routine checking of intrauterine contraceptive device 01/07/2024   Fatigue 05/19/2024   Generalized anxiety disorder with panic attacks 02/24/2023   Gestational HTN 08/13/2017   Headache 05/03/2020   Lack or loss of sexual desire 05/19/2024   MDD (major depressive disorder) 11/02/2019   Migraine 05/19/2024   Migraine without aura and without status migrainosus, not intractable 07/30/2021   Pain 12/16/2016   Postpartum care following vaginal delivery 08/14/2017   Pregnancy induced hypertension    Premenstrual headache 07/30/2021   SVD (spontaneous vaginal delivery) 3/15 08/14/2017   Thyroid cyst    Past Surgical History:  Procedure Laterality Date   HERNIA REPAIR     Patient Active Problem List   Diagnosis Date Noted   Dairy allergy 05/19/2024   Perimenopausal 05/19/2024    PCP: NA  REFERRING PROVIDER: Barbette Knock, MD  REFERRING DIAG: M62.89 (ICD-10-CM) - Other specified disorders of muscle   THERAPY DIAG:  Pelvic pain  Unspecified lack of coordination  Other low back pain  Lower abdominal pain  Other muscle spasm  Muscle weakness (generalized)  Abnormal posture  Rationale for Evaluation and Treatment: Rehabilitation  ONSET DATE: 09/30/23  SUBJECTIVE:                                                                                                                                                                                            SUBJECTIVE STATEMENT: Pt states that she is feeling pretty good over the last week. She has been able to work on more exercises this week. She states that she has not had many issues.    PAIN: 06/13/2024 Are you having pain? Yes NPRS scale: 0/10 Pain location: pelvic pain  Pain type: aching Pain description: pelvis, abdomen, low back   Aggravating factors: lifting, menstrual cycles, sitting in one position  Relieving factors: adjusting position, heating pad  PRECAUTIONS: None  RED FLAGS: None   WEIGHT BEARING RESTRICTIONS: No  FALLS:  Has patient fallen in last 6 months? 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[employer]   ACTIVITY LEVEL : very active at work; run/walks with group 1x/work  PLOF: Independent  PATIENT GOALS: to decrease pain  PERTINENT HISTORY:  Hernia repair 2002; 2 vaginal deliveries  Sexual abuse: No  BOWEL MOVEMENT: Pain with bowel movement: No Type of bowel movement:Type (Bristol Stool Scale) 3-4, Frequency 2x/week, and Strain not usually  Fully empty rectum: Yes: sometimes  Leakage: No Pads: No Fiber supplement/laxative No  URINATION: Pain with urination: No Fully empty bladder: Yes:   Stream: hard to start Urgency: Yes  Frequency: 3-4x/day; not usually waking at night  Fluid Intake: not enough - only 16 oz at 4 today Leakage: Urge to void, Walking to the bathroom, Coughing, Sneezing, Laughing, Exercise, Lifting, and running Pads: No  INTERCOURSE:  Ability to have vaginal penetration Yes  Pain with intercourse: During Penetration, Deep Penetration, and refers up into Rt abdomen; aches after intercourse  DrynessNo Climax: pain with orgasms  Marinoff Scale: 0/3 Lubricant: not usually  PREGNANCY: Vaginal deliveries 2 Tearing No Episiotomy No C-section deliveries 0 Currently pregnant  No  PROLAPSE: None   OBJECTIVE:  Note: Objective measures were completed at Evaluation unless otherwise noted.  01/11/24: PATIENT SURVEYS:   PFIQ-7: 14  COGNITION: Overall cognitive status: Within functional limits for tasks assessed     SENSATION: Light touch: Appears intact   FUNCTIONAL TESTS:  Squat: Rt weight shift and breath holding  Single leg stance:  Rt: stable  Lt: pelvic drop Curl-up test: abdominal distortion   GAIT: Assistive device utilized: None Comments: WNL  POSTURE: rounded shoulders, forward head, anterior pelvic tilt, and Lt posterior rotation, Rt thoracic curvature, Lt lumbar curvature   LUMBARAROM/PROM:  A/PROM A/PROM  Eval (% available)  Flexion 100  Extension 100, pain in low back, stretching in abdomen  Right lateral flexion 100  Left lateral flexion 100  Right rotation 100  Left rotation 100   (Blank rows = not tested)  PALPATION:   General: tenderness and restriction in low back  Pelvic Alignment: Lt pelvic rotation   Abdominal: tenderness throughout lower abdomen; apical breathing pattern                 External Perineal Exam: some dryness and labial fusion                             Internal Pelvic Floor: tenderness throughout bil superficial and deep pelvic floor muscles   Patient confirms identification and approves PT to assess internal pelvic floor and treatment Yes  PELVIC MMT:   MMT eval  Vaginal 2/5, 5 second hold, 5 repeat contractions with poor coordination  Diastasis Recti 2 finger widths  (Blank rows = not tested)        TONE: High bil superficial nd deep pelvic floor muscles   PROLAPSE: Grade 1 anterior vaginal wall laxity; grade 1 uterine ligament laxity  TODAY'S TREATMENT:  DATE:  06/13/2024 Neuromuscular re-education: Supine leg extensions 10x bil Dead bug 2 x 10 Supine  weight drop + 8 lbs 2 x 10 Modified side plank + clam shell 2 x 10 bil Half kneeling reverse chop + 5 lbs 10x  Single leg hip hinge + 5 lbs 10x bil Standing bil shoulder extension + green band 2 x 10   06/05/2024 Manual: Supine: ILU massage Bowel mobilization clockwise  Neuromuscular re-education: Bridge with hip adduction, transversus abdominus, and pelvic floor muscle 2 x 10 Bridge + march 2 x 10 Supine leg extensions 10x bil Exercises: Lower trunk rotation 2 x 10 Single knee to chest 5x bil   05/17/24 Manual: Pt provides verbal consent for internal vaginal/rectal pelvic floor exam. Internal vaginal pelvic floor muscle release to tolerance bil levator ani Pelvic floor muscle wand education for manual release of pelvic floor on her own when pain increases Supine soft tissue mobilization to lower abdomen Neuromuscular re-education: Diaphragmatic breathing to help improve pelvic floor muscle lengthening Reverse kegel 10x     PATIENT EDUCATION:  Education details: See above Person educated: Patient Education method: Programmer, Multimedia, Demonstration, Actor cues, Verbal cues, and Handouts Education comprehension: verbalized understanding  HOME EXERCISE PROGRAM: [id number]  ASSESSMENT:  CLINICAL IMPRESSION: Patient is a 39 y.o. female who was seen today for physical therapy treatment for pelvic/low back/abdominal pain, constipation, and urinary incontinence. Pt doing very well with no issues over the last week. She has also been able to work on more exercises. She did very well with all core strengthening progressions today. During dead bugs, she had some mild increase in low back tension, but with cues to correct to more neutral spine she was more comfortable. She will continue to benefit from skilled PT intervention in order to decrease pain, improve constipation, decrease urinary incontinence, and address impairments.      OBJECTIVE IMPAIRMENTS: decreased activity tolerance,  decreased coordination, decreased endurance, decreased mobility, decreased ROM, decreased strength, increased fascial restrictions, increased muscle spasms, impaired flexibility, impaired tone, improper body mechanics, postural dysfunction, and pain.   ACTIVITY LIMITATIONS: lifting, squatting, and continence  PARTICIPATION LIMITATIONS: cleaning, interpersonal relationship, driving, community activity, occupation, and exercise  PERSONAL FACTORS: 1-2 comorbidities: medical history are also affecting patient's functional outcome.   REHAB POTENTIAL: Good  CLINICAL DECISION MAKING: Evolving/moderate complexity  EVALUATION COMPLEXITY: Moderate   GOALS: Goals reviewed with patient? Yes  SHORT TERM GOALS: Target date: 02/08/2024   Pt will be independent with HEP in order to improve activity tolerance.   Baseline: Goal status: MET 02/22/24  2.  Patient will report 25% improve in pelvic pain in order to increase activity tolerance.    Baseline: 9/10 at worst Goal status:MET 04/04/24  3.  Pt will be independent with use of squatty potty, relaxed toileting mechanics, and improved bowel movement techniques in order to increase ease of bowel movements and complete evacuation.    Baseline: not using Goal status: MET 04/04/24  4.  Pt will be independent with the knack, urge suppression technique, and double voiding in order to improve bladder habits and decrease urinary incontinence.   Baseline: not using Goal status:  MET 04/04/24  5.  Pt will be able to fully relax and contract pelvic floor in order to improve pelvic pain and urinary incontinence to work with less difficulty Baseline: high tone and poor coordination with contract/relax Goal status:  MET 04/04/24  6.  Pt will be able to correctly perform diaphragmatic breathing and appropriate pressure management in order to  prevent worsening vaginal wall/uterine ligament laxity and improve pelvic floor A/ROM.   Baseline: uterine ligament  and anterior vaginal wall laxity Goal status: MET 04/04/24  LONG TERM GOALS: Target date: 06/27/2024  Pt will be independent with advanced HEP in order to improve activity tolerance.   Baseline:  Goal status:  IN PROGRESS 05/17/24  2.  Patient will report 75% improve in pelvic pain in order to increase activity tolerance.   Baseline: 9/10 at worst Goal status:  IN PROGRESS 05/17/24  3.  Pt will be able to go 2-3 hours in between voids without urgency or incontinence in order to improve QOL and perform all functional activities with less difficulty.   Baseline: strong urgency and will leak on the way to the bathroom Goal status:  IN PROGRESS 05/17/24  4.  Pt will be able to laugh, cough, sneeze, jump, lift, and run without urinary incontinence.  Baseline: leaking with all Goal status:  IN PROGRESS 05/17/24  5.  Patient will be able to perform single leg stance with good pelvic stability in order to demonstrate appropriate pelvic floor muscle and transversus abdominus strength and coordination in order to decrease urinary incontinence and improve pain.   Baseline:  Goal status:  IN PROGRESS 05/17/24  6.  Pt will report 0/10 pain with vaginal penetration in order to improve intimate relationship with partner.    Baseline: pain that stops intercourse Goal status:  IN PROGRESS 05/17/24  PLAN:  PT FREQUENCY: 1-2x/week  PT DURATION: 16 visits    PLANNED INTERVENTIONS: 97164- PT Re-evaluation, 97110-Therapeutic exercises, 97530- Therapeutic activity, 97112- Neuromuscular re-education, 97535- Self Care, 02859- Manual therapy, [phone]- Gait training, [phone]- Aquatic Therapy, [phone]- Electrical stimulation (unattended), [phone]- Traction (mechanical), [phone]- Ionotophoresis 4mg /ml Dexamethasone, 79439 (1-2 muscles), 20561 (3+ muscles)- Dry Needling, Patient/Family education, Balance training, Taping, Joint mobilization, Joint manipulation, Spinal manipulation, Spinal mobilization, Scar  mobilization, Vestibular training, Cryotherapy, Moist heat, and Biofeedback  PLAN FOR NEXT SESSION: begin pelvic floor muscle release to superficial/deep musculature vaginally; manual techniques to abdomen and low back; progress mobility activities and down training; begin pelvic stability exercises; bladder habits; bowel habits   Josette Mares, PT, DPT1/13/20264:14 PM   "

## 2024-06-20 ENCOUNTER — Ambulatory Visit

## 2024-06-20 DIAGNOSIS — R279 Unspecified lack of coordination: Secondary | ICD-10-CM

## 2024-06-20 DIAGNOSIS — M6281 Muscle weakness (generalized): Secondary | ICD-10-CM

## 2024-06-20 DIAGNOSIS — M62838 Other muscle spasm: Secondary | ICD-10-CM

## 2024-06-20 DIAGNOSIS — R1030 Lower abdominal pain, unspecified: Secondary | ICD-10-CM

## 2024-06-20 DIAGNOSIS — R102 Pelvic and perineal pain unspecified side: Secondary | ICD-10-CM | POA: Diagnosis not present

## 2024-06-20 DIAGNOSIS — M5459 Other low back pain: Secondary | ICD-10-CM

## 2024-06-20 NOTE — Therapy (Signed)
 " OUTPATIENT PHYSICAL THERAPY FEMALE PELVIC TREATMENT   Patient Name: Adriana Petersen MRN: [id number] DOB:1986-01-18, 39 y.o., female Today's Date: 06/20/2024  END OF SESSION:  PT End of Session - 06/20/24 1404     Visit Number 11    Date for Recertification  06/27/24    Authorization Type [employer]    PT Start Time 1403    PT Stop Time 1442    PT Time Calculation (min) 39 min    Activity Tolerance Patient tolerated treatment well    Behavior During Therapy [facility] for tasks assessed/performed           Past Medical History:  Diagnosis Date   Anxiety    Depression    Encounter for planned induction of labor 08/13/2017   Encounter for routine checking of intrauterine contraceptive device 01/07/2024   Fatigue 05/19/2024   Generalized anxiety disorder with panic attacks 02/24/2023   Gestational HTN 08/13/2017   Headache 05/03/2020   Lack or loss of sexual desire 05/19/2024   MDD (major depressive disorder) 11/02/2019   Migraine 05/19/2024   Migraine without aura and without status migrainosus, not intractable 07/30/2021   Pain 12/16/2016   Postpartum care following vaginal delivery 08/14/2017   Pregnancy induced hypertension    Premenstrual headache 07/30/2021   SVD (spontaneous vaginal delivery) 3/15 08/14/2017   Thyroid cyst    Past Surgical History:  Procedure Laterality Date   HERNIA REPAIR     Patient Active Problem List   Diagnosis Date Noted   Dairy allergy 05/19/2024   Perimenopausal 05/19/2024    PCP: NA  REFERRING PROVIDER: Barbette Knock, MD  REFERRING DIAG: M62.89 (ICD-10-CM) - Other specified disorders of muscle   THERAPY DIAG:  Pelvic pain  Unspecified lack of coordination  Other low back pain  Lower abdominal pain  Other muscle spasm  Muscle weakness (generalized)  Rationale for Evaluation and Treatment: Rehabilitation  ONSET DATE: 09/30/23  SUBJECTIVE:                                                                                                                                                                                            SUBJECTIVE STATEMENT: Pt states that she is feeling good today, just tired. She states that she feels like she is met her goals for being here. She feels like her low back pain is under control.    PAIN: 06/20/2024 Are you having pain? Yes NPRS scale: 0/10 Pain location: pelvic pain  Pain type: aching Pain description: pelvis, abdomen, low back   Aggravating factors: lifting, menstrual cycles, sitting in one position  Relieving factors: adjusting position, heating pad  PRECAUTIONS:  None  RED FLAGS: None   WEIGHT BEARING RESTRICTIONS: No  FALLS:  Has patient fallen in last 6 months? 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   ACTIVITY LEVEL : very active at work; run/walks with group 1x/work  PLOF: Independent  PATIENT GOALS: to decrease pain  PERTINENT HISTORY:  Hernia repair 2002; 2 vaginal deliveries  Sexual abuse: No  BOWEL MOVEMENT: Pain with bowel movement: No Type of bowel movement:Type (Bristol Stool Scale) 3-4, Frequency 2x/week, and Strain not usually  Fully empty rectum: Yes: sometimes  Leakage: No Pads: No Fiber supplement/laxative No  URINATION: Pain with urination: No Fully empty bladder: Yes:   Stream: hard to start Urgency: Yes  Frequency: 3-4x/day; not usually waking at night  Fluid Intake: not enough - only 16 oz at 4 today Leakage: Urge to void, Walking to the bathroom, Coughing, Sneezing, Laughing, Exercise, Lifting, and running Pads: No  INTERCOURSE:  Ability to have vaginal penetration Yes  Pain with intercourse: During Penetration, Deep Penetration, and refers up into Rt abdomen; aches after intercourse  DrynessNo Climax: pain with orgasms  Marinoff Scale: 0/3 Lubricant: not usually  PREGNANCY: Vaginal deliveries 2 Tearing No Episiotomy No C-section deliveries 0 Currently pregnant No  PROLAPSE: None   OBJECTIVE:   Note: Objective measures were completed at Evaluation unless otherwise noted.  01/11/24: PATIENT SURVEYS:   PFIQ-7: 68  COGNITION: Overall cognitive status: Within functional limits for tasks assessed     SENSATION: Light touch: Appears intact   FUNCTIONAL TESTS:  Squat: Rt weight shift and breath holding  Single leg stance:  Rt: stable  Lt: pelvic drop Curl-up test: abdominal distortion   GAIT: Assistive device utilized: None Comments: WNL  POSTURE: rounded shoulders, forward head, anterior pelvic tilt, and Lt posterior rotation, Rt thoracic curvature, Lt lumbar curvature   LUMBARAROM/PROM:  A/PROM A/PROM  Eval (% available)  Flexion 100  Extension 100, pain in low back, stretching in abdomen  Right lateral flexion 100  Left lateral flexion 100  Right rotation 100  Left rotation 100   (Blank rows = not tested)  PALPATION:   General: tenderness and restriction in low back  Pelvic Alignment: Lt pelvic rotation   Abdominal: tenderness throughout lower abdomen; apical breathing pattern                 External Perineal Exam: some dryness and labial fusion                             Internal Pelvic Floor: tenderness throughout bil superficial and deep pelvic floor muscles   Patient confirms identification and approves PT to assess internal pelvic floor and treatment Yes  PELVIC MMT:   MMT eval  Vaginal 2/5, 5 second hold, 5 repeat contractions with poor coordination  Diastasis Recti 2 finger widths  (Blank rows = not tested)        TONE: High bil superficial nd deep pelvic floor muscles   PROLAPSE: Grade 1 anterior vaginal wall laxity; grade 1 uterine ligament laxity  TODAY'S TREATMENT:  DATE:  06/20/24 Neuromuscular re-education: Bridges 10x Modified dead bug with weighted shoulder flexion 9 lbs 2 x 10 Supine weight drop +  9 lbs 2 x 10 Therapeutic activities: Single leg hip hinge + row + 5 lbs 10x bil Curtsey lunge +10 lbs 10x bil   06/13/2024 Neuromuscular re-education: Supine leg extensions 10x bil Dead bug 2 x 10 Supine weight drop + 8 lbs 2 x 10 Modified side plank + clam shell 2 x 10 bil Half kneeling reverse chop + 5 lbs 10x  Single leg hip hinge + 5 lbs 10x bil Standing bil shoulder extension + green band 2 x 10   06/05/2024 Manual: Supine: ILU massage Bowel mobilization clockwise  Neuromuscular re-education: Bridge with hip adduction, transversus abdominus, and pelvic floor muscle 2 x 10 Bridge + march 2 x 10 Supine leg extensions 10x bil Exercises: Lower trunk rotation 2 x 10 Single knee to chest 5x bil   PATIENT EDUCATION:  Education details: See above Person educated: Patient Education method: Programmer, Multimedia, Demonstration, Tactile cues, Verbal cues, and Handouts Education comprehension: verbalized understanding  HOME EXERCISE PROGRAM: [id number]  ASSESSMENT:  CLINICAL IMPRESSION: Patient is a 39 y.o. female who was seen today for physical therapy treatment for pelvic/low back/abdominal pain, constipation, and urinary incontinence. Pt has been doing very well in PT and feels like she has met her goals for being here. Her low back pain and pelvic pain is under control. She feels confident with exercises to help maintain progress. Due to progress and having met goals for being in PT, she is prepared to discharge at this time; she was encouraged to call with any questions or concerns.     OBJECTIVE IMPAIRMENTS: decreased activity tolerance, decreased coordination, decreased endurance, decreased mobility, decreased ROM, decreased strength, increased fascial restrictions, increased muscle spasms, impaired flexibility, impaired tone, improper body mechanics, postural dysfunction, and pain.   ACTIVITY LIMITATIONS: lifting, squatting, and continence  PARTICIPATION LIMITATIONS: cleaning,  interpersonal relationship, driving, community activity, occupation, and exercise  PERSONAL FACTORS: 1-2 comorbidities: medical history are also affecting patient's functional outcome.   REHAB POTENTIAL: Good  CLINICAL DECISION MAKING: Evolving/moderate complexity  EVALUATION COMPLEXITY: Moderate   GOALS: Goals reviewed with patient? Yes  SHORT TERM GOALS: Target date: 02/08/2024   Pt will be independent with HEP in order to improve activity tolerance.   Baseline: Goal status: MET 02/22/24  2.  Patient will report 25% improve in pelvic pain in order to increase activity tolerance.    Baseline: 9/10 at worst Goal status:MET 04/04/24  3.  Pt will be independent with use of squatty potty, relaxed toileting mechanics, and improved bowel movement techniques in order to increase ease of bowel movements and complete evacuation.    Baseline: not using Goal status: MET 04/04/24  4.  Pt will be independent with the knack, urge suppression technique, and double voiding in order to improve bladder habits and decrease urinary incontinence.   Baseline: not using Goal status:  MET 04/04/24  5.  Pt will be able to fully relax and contract pelvic floor in order to improve pelvic pain and urinary incontinence to work with less difficulty Baseline: high tone and poor coordination with contract/relax Goal status:  MET 04/04/24  6.  Pt will be able to correctly perform diaphragmatic breathing and appropriate pressure management in order to prevent worsening vaginal wall/uterine ligament laxity and improve pelvic floor A/ROM.   Baseline: uterine ligament and anterior vaginal wall laxity Goal status: MET 04/04/24  LONG TERM GOALS: Target  date: 06/27/2024  Pt will be independent with advanced HEP in order to improve activity tolerance.   Baseline:  Goal status:  MET 06/21/23  2.  Patient will report 75% improve in pelvic pain in order to increase activity tolerance.   Baseline: 9/10 at  worst Goal status: MET 06/21/23  3.  Pt will be able to go 2-3 hours in between voids without urgency or incontinence in order to improve QOL and perform all functional activities with less difficulty.   Baseline: strong urgency and will leak on the way to the bathroom Goal status:  MET 06/21/23  4.  Pt will be able to laugh, cough, sneeze, jump, lift, and run without urinary incontinence.  Baseline: leaking with all Goal status:  MET 06/21/23  5.  Patient will be able to perform single leg stance with good pelvic stability in order to demonstrate appropriate pelvic floor muscle and transversus abdominus strength and coordination in order to decrease urinary incontinence and improve pain.   Baseline:  Goal status:  MET 06/21/23  6.  Pt will report 0/10 pain with vaginal penetration in order to improve intimate relationship with partner.    Baseline: pain that stops intercourse Goal status:  MET 06/21/23  PLAN:  PT FREQUENCY: -  PT DURATION: -  PLANNED INTERVENTIONS: -  PLAN FOR NEXT SESSION: DC  PHYSICAL THERAPY DISCHARGE SUMMARY  Visits from Start of Care: 11  Current functional level related to goals / functional outcomes: Independent   Remaining deficits: See above   Education / Equipment: HEP   Patient agrees to discharge. Patient goals were met. Patient is being discharged due to being pleased with the current functional level.  Josette Mares, PT, DPT01/20/262:04 PM   "

## 2024-06-23 ENCOUNTER — Encounter: Payer: Self-pay | Admitting: Family

## 2024-06-26 ENCOUNTER — Encounter: Admitting: Family
